# Patient Record
Sex: Male | Born: 1970 | Hispanic: No | Marital: Married | Smoking: Light tobacco smoker
Health system: Southern US, Community
[De-identification: ages and names within clinical notes are randomized; demographics above are authoritative.]

---

## 2015-10-14 ENCOUNTER — Encounter: Payer: Self-pay | Admitting: Family Medicine

## 2015-10-14 ENCOUNTER — Other Ambulatory Visit: Payer: Self-pay | Admitting: Family Medicine

## 2015-10-14 ENCOUNTER — Ambulatory Visit (INDEPENDENT_AMBULATORY_CARE_PROVIDER_SITE_OTHER): Payer: No Typology Code available for payment source | Admitting: Family Medicine

## 2015-10-14 VITALS — BP 128/78 | HR 83 | Temp 98.2°F | Resp 14 | Ht 67.0 in | Wt 154.0 lb

## 2015-10-14 DIAGNOSIS — Z0000 Encounter for general adult medical examination without abnormal findings: Secondary | ICD-10-CM

## 2015-10-14 DIAGNOSIS — IMO0001 Reserved for inherently not codable concepts without codable children: Secondary | ICD-10-CM

## 2015-10-14 DIAGNOSIS — Z23 Encounter for immunization: Secondary | ICD-10-CM

## 2015-10-14 DIAGNOSIS — K029 Dental caries, unspecified: Secondary | ICD-10-CM

## 2015-10-14 DIAGNOSIS — R030 Elevated blood-pressure reading, without diagnosis of hypertension: Secondary | ICD-10-CM

## 2015-10-14 LAB — COMPLETE METABOLIC PANEL WITH GFR
ALBUMIN: 4.2 g/dL (ref 3.6–5.1)
ALK PHOS: 48 U/L (ref 40–115)
ALT: 15 U/L (ref 9–46)
AST: 13 U/L (ref 10–40)
BILIRUBIN TOTAL: 0.6 mg/dL (ref 0.2–1.2)
BUN: 15 mg/dL (ref 7–25)
CALCIUM: 9.1 mg/dL (ref 8.6–10.3)
CO2: 25 mmol/L (ref 20–31)
CREATININE: 0.85 mg/dL (ref 0.60–1.35)
Chloride: 103 mmol/L (ref 98–110)
GFR, Est African American: >89 mL/min (ref >=60)
GFR, Est Non African American: >89 mL/min (ref >=60)
Glucose, Bld: 147 mg/dL — ABNORMAL HIGH (ref 65–99)
POTASSIUM: 3.9 mmol/L (ref 3.5–5.3)
Sodium: 138 mmol/L (ref 135–146)
Total Protein: 6.7 g/dL (ref 6.1–8.1)

## 2015-10-14 LAB — CBC WITH DIFFERENTIAL/PLATELET
Basophils Absolute: 0 cells/uL (ref 0–200)
Basophils Relative: 0 %
EOS PCT: 5 %
Eosinophils Absolute: 210 cells/uL (ref 15–500)
HCT: 43.1 % (ref 38.5–50.0)
Hemoglobin: 14.3 g/dL (ref 13.2–17.1)
LYMPHS ABS: 1890 {cells}/uL (ref 850–3900)
LYMPHS PCT: 45 %
MCH: 27 pg (ref 27.0–33.0)
MCHC: 33.2 g/dL (ref 32.0–36.0)
MCV: 81.3 fL (ref 80.0–100.0)
MONOS PCT: 6 %
MPV: 8.6 fL (ref 7.5–12.5)
Monocytes Absolute: 252 cells/uL (ref 200–950)
NEUTROS PCT: 44 %
Neutro Abs: 1848 cells/uL (ref 1500–7800)
PLATELETS: 231 10*3/uL (ref 140–400)
RBC: 5.3 MIL/uL (ref 4.20–5.80)
RDW: 14.1 % (ref 11.0–15.0)
WBC: 4.2 10*3/uL (ref 3.8–10.8)

## 2015-10-14 LAB — LIPID PANEL
CHOLESTEROL: 230 mg/dL — AB (ref 125–200)
HDL: 38 mg/dL — ABNORMAL LOW (ref >=40)
LDL Cholesterol: 163 mg/dL — ABNORMAL HIGH (ref <130)
TRIGLYCERIDES: 146 mg/dL (ref <150)
Total CHOL/HDL Ratio: 6.1 Ratio — ABNORMAL HIGH (ref <=5.0)
VLDL: 29 mg/dL (ref <30)

## 2015-10-14 NOTE — Patient Instructions (Signed)
Check

## 2015-10-14 NOTE — Progress Notes (Signed)
Patient ID: Grant King, male   DOB: 1970/11/11, 45 y.o.   MRN: [id number]   Grant King, is a 45 y.o. male  [id number]  [id number]  DOB - August 20, 1970  CC:  Chief Complaint  Patient presents with  . Establish Care       HPI: Grant King is a 45 y.o. male here to establish care. He reports being in good heatlh, without chronic illnesses. He has checked his blood pressure at drug store several times recently and is concerned about elevated readings of 150 ish. His BP here today is 128/78, repeat 120/84. (a reading using home equipment at same time is 130/84. He only other complaint is of dental caries and needing a dental referral. He has recently received an orange card.  He is on on current medications.  Health Maintenance: He is in need of Tdap and HIV screening. He does not use tobacco, alcohol, or illicit. He follows a regular diet and does not exercise regularly.   No Known Allergies History reviewed. No pertinent past medical history. No current outpatient prescriptions on file prior to visit.   No current facility-administered medications on file prior to visit.   Family History  Problem Relation Age of Onset  . Kidney disease Father    Social History   Social History  . Marital Status: Married    Spouse Name: N/A  . Number of Children: N/A  . Years of Education: N/A   Occupational History  . Not on file.   Social History Main Topics  . Smoking status: Light Tobacco Smoker -- 0.25 packs/day    Types: Cigarettes  . Smokeless tobacco: Not on file  . Alcohol Use: No  . Drug Use: No  . Sexual Activity: Not on file   Other Topics Concern  . Not on file   Social History Narrative  . No narrative on file    Review of Systems: Constitutional: Negative for fever, chills, appetite change, weight loss,  Fatigue. Skin: Negative for rashes or lesions of concern. HENT: Negative for ear pain, ear discharge.nose bleeds. Dental Caries Eyes: Negative  for pain, discharge, redness, itching and visual disturbance. Neck: Negative for pain, stiffness Respiratory: Negative for cough, shortness of breath,   Cardiovascular: Negative for chest pain, palpitations and leg swelling. Gastrointestinal: Negative for abdominal pain, nausea, vomiting, diarrhea, constipations Genitourinary: Negative for dysuria, urgency, frequency, hematuria,  Musculoskeletal: Negative for back pain, joint pain, joint  swelling, and gait problem.Negative for weakness. Neurological: Negative for dizziness, tremors, seizures, syncope,   light-headedness, numbness and headaches.  Hematological: Negative for easy bruising or bleeding Psychiatric/Behavioral: Negative for depression, anxiety, decreased concentration, confusion   Objective:   Filed Vitals:   10/14/15 1100  BP: 128/78  Pulse: 83  Temp: 98.2 F (36.8 C)  Resp: 14    Physical Exam: Constitutional: Patient appears well-developed and well-nourished. No distress. HENT: Normocephalic, atraumatic, External right and left ear normal. Oropharynx is clear and moist. There is one dark tooth on upper left. Eyes: Conjunctivae and EOM are normal. PERRLA, no scleral icterus. Neck: Normal ROM. Neck supple. No lymphadenopathy, No thyromegaly. CVS: RRR, S1/S2 +, no murmurs, no gallops, no rubs Pulmonary: Effort and breath sounds normal, no stridor, rhonchi, wheezes, rales.  Abdominal: Soft. Normoactive BS,, no distension, tenderness, rebound or guarding.  Musculoskeletal: Normal range of motion. No edema and no tenderness.  Neuro: Alert.Normal muscle tone coordination. Non-focal Skin: Skin is warm and dry. No rash noted. Not diaphoretic. No erythema. No pallor. Psychiatric: Normal mood  and affect. Behavior, judgment, thought content normal.  No results found for: WBC, HGB, HCT, MCV, PLT No results found for: CREATININE, BUN, NA, K, CL, CO2  No results found for: HGBA1C Lipid Panel  No results found for: CHOL, TRIG,  HDL, CHOLHDL, VLDL, LDLCALC     Assessment and plan:   1. Healthcare maintenance  - COMPLETE METABOLIC PANEL WITH GFR - CBC with Differential - Lipid panel  2. Dental caries -Referral to dentist.  3. Elevated blood pressure -provided home blood pressure monitor and instructions on how to interpret reading.   Return if symptoms worsen or fail to improve.  The patient was given clear instructions to go to ER or return to medical center if symptoms don't improve, worsen or new problems develop. The patient verbalized understanding.    Henrietta HooverLinda C Lakeita Panther FNP  10/14/2015, 12:16 PM

## 2015-10-15 ENCOUNTER — Telehealth: Payer: Self-pay

## 2015-10-15 ENCOUNTER — Other Ambulatory Visit: Payer: Self-pay | Admitting: Family Medicine

## 2015-10-15 LAB — HIV ANTIBODY (ROUTINE TESTING W REFLEX): HIV 1&2 Ab, 4th Generation: NONREACTIVE

## 2015-10-15 NOTE — Telephone Encounter (Signed)
-----   Message from Henrietta HooverLinda C Bernhardt, NP sent at 10/15/2015  8:05 AM EDT ----- Glucose 147 but not fasting.Cholesterol 230 total and LDL 163. We need to prescribed medication for this. CBC ok.

## 2015-10-18 ENCOUNTER — Other Ambulatory Visit: Payer: Self-pay | Admitting: Family Medicine

## 2015-10-18 MED ORDER — PRAVASTATIN SODIUM 40 MG PO TABS: 40.0000 mg | ORAL_TABLET | Freq: Every day | ORAL | Status: DC

## 2015-10-18 NOTE — Progress Notes (Signed)
Tried to contact patient by phone, have been unsuccessful. Will mail letter. Thanks!

## 2015-10-18 NOTE — Telephone Encounter (Signed)
Letter has been mailed, since unable to contact patient by phone. Thanks!

## 2015-10-19 ENCOUNTER — Other Ambulatory Visit: Payer: Self-pay

## 2015-10-19 MED ORDER — PRAVASTATIN SODIUM 40 MG PO TABS: 40.0000 mg | ORAL_TABLET | Freq: Every day | ORAL | Status: DC

## 2015-10-19 NOTE — Telephone Encounter (Signed)
Refill sent into pharmacy. Thanks!  

## 2016-02-16 ENCOUNTER — Other Ambulatory Visit: Payer: Self-pay | Admitting: Family Medicine

## 2016-04-05 ENCOUNTER — Other Ambulatory Visit: Payer: Self-pay | Admitting: Family Medicine

## 2016-04-21 ENCOUNTER — Ambulatory Visit: Payer: No Typology Code available for payment source | Admitting: Family Medicine

## 2016-04-21 DIAGNOSIS — E7800 Pure hypercholesterolemia, unspecified: Secondary | ICD-10-CM | POA: Insufficient documentation

## 2016-05-05 ENCOUNTER — Other Ambulatory Visit: Payer: Self-pay | Admitting: Family Medicine

## 2016-06-04 ENCOUNTER — Other Ambulatory Visit: Payer: Self-pay | Admitting: Family Medicine

## 2016-09-13 ENCOUNTER — Encounter (HOSPITAL_COMMUNITY): Payer: Self-pay | Admitting: *Deleted

## 2016-09-13 ENCOUNTER — Emergency Department (HOSPITAL_COMMUNITY)
Admission: EM | Admit: 2016-09-13 | Discharge: 2016-09-13 | Disposition: A | Discharge status: Home / Self Care | Payer: BLUE CROSS/BLUE SHIELD | Attending: Emergency Medicine | Admitting: Emergency Medicine

## 2016-09-13 DIAGNOSIS — R05 Cough: Secondary | ICD-10-CM | POA: Diagnosis present

## 2016-09-13 DIAGNOSIS — B349 Viral infection, unspecified: Secondary | ICD-10-CM | POA: Insufficient documentation

## 2016-09-13 DIAGNOSIS — R059 Cough, unspecified: Secondary | ICD-10-CM

## 2016-09-13 DIAGNOSIS — F17210 Nicotine dependence, cigarettes, uncomplicated: Secondary | ICD-10-CM | POA: Insufficient documentation

## 2016-09-13 LAB — BASIC METABOLIC PANEL
Anion gap: 9 (ref 5–15)
BUN: 10 mg/dL (ref 6–20)
CALCIUM: 9 mg/dL (ref 8.9–10.3)
CO2: 25 mmol/L (ref 22–32)
Chloride: 103 mmol/L (ref 101–111)
Creatinine, Ser: 0.93 mg/dL (ref 0.61–1.24)
GFR calc Af Amer: >60 mL/min (ref >60)
Glucose, Bld: 163 mg/dL — ABNORMAL HIGH (ref 65–99)
Potassium: 3.8 mmol/L (ref 3.5–5.1)
Sodium: 137 mmol/L (ref 135–145)

## 2016-09-13 LAB — CBC
HEMATOCRIT: 39.3 % (ref 39.0–52.0)
Hemoglobin: 13.2 g/dL (ref 13.0–17.0)
MCH: 27 pg (ref 26.0–34.0)
MCHC: 33.6 g/dL (ref 30.0–36.0)
MCV: 80.5 fL (ref 78.0–100.0)
Platelets: 193 10*3/uL (ref 150–400)
RBC: 4.88 MIL/uL (ref 4.22–5.81)
RDW: 12.4 % (ref 11.5–15.5)
WBC: 5 10*3/uL (ref 4.0–10.5)

## 2016-09-13 MED ORDER — ACETAMINOPHEN 325 MG PO TABS
ORAL_TABLET | ORAL | Status: AC
  Filled 2016-09-13: qty 2

## 2016-09-13 MED ORDER — IBUPROFEN 800 MG PO TABS
800.0000 mg | ORAL_TABLET | Freq: Once | ORAL | Status: AC
  Administered 2016-09-13: 800 mg via ORAL
  Filled 2016-09-13: qty 1

## 2016-09-13 MED ORDER — ACETAMINOPHEN 325 MG PO TABS
650.0000 mg | ORAL_TABLET | Freq: Once | ORAL | Status: AC | PRN
  Administered 2016-09-13: 650 mg via ORAL

## 2016-09-13 NOTE — ED Triage Notes (Addendum)
Pt to ED c/o generalized body aches, headache, lower extremity pain and fever onset today. Pt has been taking allergy medication OTC with relief

## 2016-09-13 NOTE — ED Provider Notes (Signed)
[facility]-EMERGENCY DEPT Provider Note   CSN: [id number] Arrival date & time: 09/13/16  0005     History   Chief Complaint Chief Complaint  Patient presents with  . Influenza    HPI Grant King is a 46 y.o. male.  The history is provided by the patient.  Influenza  Presenting symptoms: cough, fatigue, fever, headache, myalgias and sore throat   Presenting symptoms: no vomiting   Severity:  Moderate Onset quality:  Gradual Duration:  1 day Progression:  Worsening Chronicity:  New Relieved by:  None tried Worsened by:  Nothing   Patient presents with flu like illness - cough/HA/fever/sore throat No vomiting No recent travel   Patient Active Problem List   Diagnosis Date Noted  . Pure hypercholesterolemia 04/21/2016    History reviewed. No pertinent surgical history.     Home Medications    Prior to Admission medications   Not on File    Family History Family History  Problem Relation Age of Onset  . Kidney disease Father     Social History Social History  Substance Use Topics  . Smoking status: Light Tobacco Smoker    Packs/day: 0.25    Types: Cigarettes  . Smokeless tobacco: Never Used  . Alcohol use No     Allergies   Patient has no known allergies.   Review of Systems Review of Systems  Constitutional: Positive for fatigue and fever.  HENT: Positive for sore throat.   Respiratory: Positive for cough.   Gastrointestinal: Negative for vomiting.  Musculoskeletal: Positive for myalgias.  Neurological: Positive for headaches.  All other systems reviewed and are negative.    Physical Exam Updated Vital Signs BP (!) 135/100 (BP Location: Right Arm)   Pulse 79   Temp 98.8 F (37.1 C) (Oral)   Resp 18   SpO2 99%   Physical Exam CONSTITUTIONAL: sleeping but easily arousable, Well developed/well nourished HEAD: Normocephalic/atraumatic EYES: EOMI/PERRL ENMT: Mucous membranes moist NECK: supple no meningeal  signs SPINE/BACK:entire spine nontender CV: S1/S2 noted, no murmurs/rubs/gallops noted LUNGS: Lungs are clear to auscultation bilaterally, no apparent distress ABDOMEN: soft, nontender, no rebound or guarding, bowel sounds noted throughout abdomen GU:no cva tenderness NEURO: Pt is awake/alert/appropriate, moves all extremitiesx4.  No facial droop.   EXTREMITIES: pulses normal/equal, full ROM SKIN: warm, color normal PSYCH: no abnormalities of mood noted, alert and oriented to situation   ED Treatments / Results  Labs (all labs ordered are listed, but only abnormal results are displayed) Labs Reviewed  BASIC METABOLIC PANEL - Abnormal; Notable for the following:       Result Value   Glucose, Bld 163 (*)    All other components within normal limits  CBC    EKG  EKG Interpretation None       Radiology No results found.  Procedures Procedures (including critical care time)  Medications Ordered in ED Medications  acetaminophen (TYLENOL) tablet 650 mg (650 mg Oral Given 09/13/16 0024)  ibuprofen (ADVIL,MOTRIN) tablet 800 mg (800 mg Oral Given 09/13/16 5284)     Initial Impression / Assessment and Plan / ED Course  I have reviewed the triage vital signs and the nursing notes.  Pertinent labs results that were available during my care of the patient were reviewed by me and considered in my medical decision making (see chart for details).     Pt well appearing He reports cough/congestion/Ha He is nontoxic He is not septic appearing He has been in the ED for several hours and is  now feeling improved He is requesting discharge He reports no travel He has no other significant health problems  I feel it is reasonable for him to be discharged   Final Clinical Impressions(s) / ED Diagnoses   Final diagnoses:  Cough  Viral syndrome    New Prescriptions There are no discharge medications for this patient.    Zadie Rhine, MD 09/13/16 [phone]

## 2016-09-13 NOTE — ED Provider Notes (Signed)
Pt speaks english fluently on my exam    Zadie Rhine, MD 09/13/16 [phone]

## 2016-12-19 ENCOUNTER — Emergency Department (HOSPITAL_COMMUNITY)
Admission: EM | Admit: 2016-12-19 | Discharge: 2016-12-19 | Discharge status: Absent without leave | Payer: No Typology Code available for payment source

## 2016-12-19 NOTE — ED Notes (Signed)
Patient decided to leave and said he would go to his regular doctor or [facility].

## 2018-03-10 ENCOUNTER — Emergency Department (HOSPITAL_COMMUNITY)
Admission: EM | Admit: 2018-03-10 | Discharge: 2018-03-10 | Disposition: A | Discharge status: Home / Self Care | Payer: BLUE CROSS/BLUE SHIELD | Attending: Emergency Medicine | Admitting: Emergency Medicine

## 2018-03-10 ENCOUNTER — Encounter (HOSPITAL_COMMUNITY): Payer: Self-pay

## 2018-03-10 ENCOUNTER — Other Ambulatory Visit: Payer: Self-pay

## 2018-03-10 ENCOUNTER — Emergency Department (HOSPITAL_COMMUNITY): Payer: BLUE CROSS/BLUE SHIELD

## 2018-03-10 DIAGNOSIS — R079 Chest pain, unspecified: Secondary | ICD-10-CM | POA: Insufficient documentation

## 2018-03-10 DIAGNOSIS — Z5321 Procedure and treatment not carried out due to patient leaving prior to being seen by health care provider: Secondary | ICD-10-CM | POA: Insufficient documentation

## 2018-03-10 DIAGNOSIS — R0602 Shortness of breath: Secondary | ICD-10-CM | POA: Diagnosis not present

## 2018-03-10 LAB — I-STAT TROPONIN, ED: Troponin i, poc: 0 ng/mL (ref 0.00–0.08)

## 2018-03-10 LAB — BASIC METABOLIC PANEL
ANION GAP: 7 (ref 5–15)
BUN: 13 mg/dL (ref 6–20)
CO2: 24 mmol/L (ref 22–32)
Calcium: 9.2 mg/dL (ref 8.9–10.3)
Chloride: 106 mmol/L (ref 98–111)
Creatinine, Ser: 0.8 mg/dL (ref 0.61–1.24)
Glucose, Bld: 173 mg/dL — ABNORMAL HIGH (ref 70–99)
POTASSIUM: 3.8 mmol/L (ref 3.5–5.1)
SODIUM: 137 mmol/L (ref 135–145)

## 2018-03-10 LAB — CBC
HCT: 43.8 % (ref 39.0–52.0)
HEMOGLOBIN: 14.2 g/dL (ref 13.0–17.0)
MCH: 27.7 pg (ref 26.0–34.0)
MCHC: 32.4 g/dL (ref 30.0–36.0)
MCV: 85.4 fL (ref 78.0–100.0)
Platelets: 219 10*3/uL (ref 150–400)
RBC: 5.13 MIL/uL (ref 4.22–5.81)
RDW: 12 % (ref 11.5–15.5)
WBC: 6.1 10*3/uL (ref 4.0–10.5)

## 2018-03-10 NOTE — ED Triage Notes (Signed)
Pt from home with chest pain and shortness of breath for the last 2 days.  Patient states had a cough 3 days prior to that.  Pain with any movement and difficult to talk in full sentences.  A&Ox4 a this time.

## 2018-03-10 NOTE — ED Notes (Signed)
At the desk requesting to leave.  Encouraged to stay without success.  Left at this time.

## 2018-03-15 ENCOUNTER — Emergency Department (HOSPITAL_COMMUNITY)
Admission: EM | Admit: 2018-03-15 | Discharge: 2018-03-15 | Disposition: A | Discharge status: Home / Self Care | Payer: BLUE CROSS/BLUE SHIELD | Attending: Emergency Medicine | Admitting: Emergency Medicine

## 2018-03-15 ENCOUNTER — Encounter (HOSPITAL_COMMUNITY): Payer: Self-pay | Admitting: Nurse Practitioner

## 2018-03-15 ENCOUNTER — Emergency Department (HOSPITAL_COMMUNITY): Payer: BLUE CROSS/BLUE SHIELD

## 2018-03-15 DIAGNOSIS — F17210 Nicotine dependence, cigarettes, uncomplicated: Secondary | ICD-10-CM | POA: Insufficient documentation

## 2018-03-15 DIAGNOSIS — J209 Acute bronchitis, unspecified: Secondary | ICD-10-CM

## 2018-03-15 DIAGNOSIS — E7800 Pure hypercholesterolemia, unspecified: Secondary | ICD-10-CM | POA: Diagnosis not present

## 2018-03-15 DIAGNOSIS — J4521 Mild intermittent asthma with (acute) exacerbation: Secondary | ICD-10-CM | POA: Diagnosis not present

## 2018-03-15 DIAGNOSIS — R05 Cough: Secondary | ICD-10-CM | POA: Diagnosis present

## 2018-03-15 MED ORDER — ALBUTEROL (5 MG/ML) CONTINUOUS INHALATION SOLN
10.0000 mg/h | INHALATION_SOLUTION | RESPIRATORY_TRACT | Status: DC
  Administered 2018-03-15: 10 mg/h via RESPIRATORY_TRACT
  Filled 2018-03-15: qty 20

## 2018-03-15 MED ORDER — DEXAMETHASONE SODIUM PHOSPHATE 10 MG/ML IJ SOLN
10.0000 mg | Freq: Once | INTRAMUSCULAR | Status: AC
  Administered 2018-03-15: 10 mg via INTRAMUSCULAR
  Filled 2018-03-15: qty 1

## 2018-03-15 MED ORDER — AZITHROMYCIN 250 MG PO TABS
500.0000 mg | ORAL_TABLET | Freq: Once | ORAL | Status: AC
  Administered 2018-03-15: 500 mg via ORAL
  Filled 2018-03-15: qty 2

## 2018-03-15 MED ORDER — AZITHROMYCIN 250 MG PO TABS: ORAL_TABLET | ORAL | 0 refills | Status: DC

## 2018-03-15 MED ORDER — PREDNISONE 10 MG (21) PO TBPK: ORAL_TABLET | ORAL | 0 refills | Status: AC

## 2018-03-15 MED ORDER — AEROCHAMBER PLUS FLO-VU MEDIUM MISC
1.0000 | Freq: Once | Status: AC
  Administered 2018-03-15: 1
  Filled 2018-03-15: qty 1

## 2018-03-15 MED ORDER — ALBUTEROL SULFATE HFA 108 (90 BASE) MCG/ACT IN AERS
1.0000 | INHALATION_SPRAY | RESPIRATORY_TRACT | Status: DC | PRN
  Administered 2018-03-15: 1 via RESPIRATORY_TRACT
  Filled 2018-03-15: qty 6.7

## 2018-03-15 MED ORDER — ALBUTEROL SULFATE (2.5 MG/3ML) 0.083% IN NEBU
5.0000 mg | INHALATION_SOLUTION | Freq: Once | RESPIRATORY_TRACT | Status: AC
  Administered 2018-03-15: 5 mg via RESPIRATORY_TRACT
  Filled 2018-03-15: qty 6

## 2018-03-15 NOTE — ED Notes (Signed)
RN made aware of elevated BP 

## 2018-03-15 NOTE — ED Triage Notes (Signed)
Pt is c/o severe productive cough that has being ongoing for over 2 weeks. Remarks on green tinged phlegm and being "winded" when he coughs so bad.

## 2018-03-15 NOTE — ED Notes (Signed)
Patient refused to wait after the IM medication before leaving.

## 2018-03-15 NOTE — ED Provider Notes (Signed)
[facility]-EMERGENCY DEPT Provider Note   CSN: [id number] Arrival date & time: 03/15/18  1623     History   Chief Complaint Chief Complaint  Patient presents with  . Cough    HPI Grant King is a 47 y.o. male.  Pt presents to the ED today with a cough.  The pt said he has had a cough for about 2 weeks.  It started after smoking a cigarette which he normally does not do.  He has not had a fever.  He has been bringing up green phlegm.     History reviewed. No pertinent past medical history.  Patient Active Problem List   Diagnosis Date Noted  . Pure hypercholesterolemia 04/21/2016    History reviewed. No pertinent surgical history.      Home Medications    Prior to Admission medications   Medication Sig Start Date End Date Taking? Authorizing Provider  loratadine (ALLERGY) 10 MG tablet Take 10 mg by mouth daily as needed for allergies.   Yes [provider]  azithromycin (ZITHROMAX) 250 MG tablet Take 1 tablet every day. 03/16/18   Jacalyn Lefevre, MD  predniSONE (STERAPRED UNI-PAK 21 TAB) 10 MG (21) TBPK tablet Take 6 tabs by mouth daily  for 2 days, then 5 tabs for 2 days, then 4 tabs for 2 days, then 3 tabs for 2 days, 2 tabs for 2 days, then 1 tab by mouth daily for 2 days 03/15/18   Jacalyn Lefevre, MD    Family History Family History  Problem Relation Age of Onset  . Kidney disease Father     Social History Social History   Tobacco Use  . Smoking status: Light Tobacco Smoker    Packs/day: 0.25    Types: Cigarettes  . Smokeless tobacco: Never Used  Substance Use Topics  . Alcohol use: No  . Drug use: No     Allergies   Patient has no known allergies.   Review of Systems Review of Systems  Respiratory: Positive for cough, shortness of breath and wheezing.   All other systems reviewed and are negative.    Physical Exam Updated Vital Signs BP (!) 158/98   Pulse 86   Temp 98 F (36.7 C) (Oral)   Resp 15    SpO2 100%   Physical Exam  Constitutional: He is oriented to person, place, and time. He appears well-developed and well-nourished.  HENT:  Head: Normocephalic and atraumatic.  Right Ear: External ear normal.  Left Ear: External ear normal.  Nose: Nose normal.  Mouth/Throat: Oropharynx is clear and moist.  Eyes: Pupils are equal, round, and reactive to light. Conjunctivae and EOM are normal.  Neck: Normal range of motion. Neck supple.  Cardiovascular: Normal rate, regular rhythm, normal heart sounds and intact distal pulses.  Pulmonary/Chest: He has wheezes.  Abdominal: Soft. Bowel sounds are normal.  Musculoskeletal: Normal range of motion.  Neurological: He is alert and oriented to person, place, and time.  Skin: Skin is warm. Capillary refill takes less than 2 seconds.  Psychiatric: He has a normal mood and affect. His behavior is normal. Judgment and thought content normal.  Nursing note and vitals reviewed.    ED Treatments / Results  Labs (all labs ordered are listed, but only abnormal results are displayed) Labs Reviewed - No data to display  EKG None  Radiology Dg Chest 2 View  Result Date: 03/15/2018 CLINICAL DATA:  Shortness of breath EXAM: CHEST - 2 VIEW COMPARISON:  03/10/2018 FINDINGS:  The heart size and mediastinal contours are within normal limits. Both lungs are clear. The visualized skeletal structures are unremarkable. IMPRESSION: No active cardiopulmonary disease. Electronically Signed   By: Jasmine Pang M.D.   On: 03/15/2018 17:19    Procedures Procedures (including critical care time)  Medications Ordered in ED Medications  albuterol (PROVENTIL,VENTOLIN) solution continuous neb (10 mg/hr Nebulization New Bag/Given 03/15/18 2022)  dexamethasone (DECADRON) injection 10 mg (has no administration in time range)  albuterol (PROVENTIL HFA;VENTOLIN HFA) 108 (90 Base) MCG/ACT inhaler 1-2 puff (has no administration in time range)  AEROCHAMBER PLUS FLO-VU  MEDIUM MISC 1 each (has no administration in time range)  azithromycin (ZITHROMAX) tablet 500 mg (has no administration in time range)  albuterol (PROVENTIL) (2.5 MG/3ML) 0.083% nebulizer solution 5 mg (5 mg Nebulization Given 03/15/18 1648)     Initial Impression / Assessment and Plan / ED Course  I have reviewed the triage vital signs and the nursing notes.  Pertinent labs & imaging results that were available during my care of the patient were reviewed by me and considered in my medical decision making (see chart for details).    Pt is feeling much better.  He will be started on zithromax for bronchitis.  He will also be started on a prednisone taper and instructed to avoid smoke.  Return if worse.  Final Clinical Impressions(s) / ED Diagnoses   Final diagnoses:  Acute bronchitis, unspecified organism  Mild intermittent reactive airway disease with acute exacerbation    ED Discharge Orders         Ordered    azithromycin (ZITHROMAX) 250 MG tablet     03/15/18 2132    predniSONE (STERAPRED UNI-PAK 21 TAB) 10 MG (21) TBPK tablet     03/15/18 2132           Jacalyn Lefevre, MD 03/15/18 2135

## 2019-05-22 ENCOUNTER — Encounter (HOSPITAL_COMMUNITY): Payer: Self-pay | Admitting: Emergency Medicine

## 2019-05-22 ENCOUNTER — Emergency Department (HOSPITAL_COMMUNITY): Payer: BC Managed Care – PPO

## 2019-05-22 ENCOUNTER — Other Ambulatory Visit: Payer: Self-pay

## 2019-05-22 ENCOUNTER — Emergency Department (HOSPITAL_COMMUNITY)
Admission: EM | Admit: 2019-05-22 | Discharge: 2019-05-22 | Disposition: A | Discharge status: Home / Self Care | Payer: BC Managed Care – PPO | Attending: Emergency Medicine | Admitting: Emergency Medicine

## 2019-05-22 DIAGNOSIS — U071 COVID-19: Secondary | ICD-10-CM | POA: Insufficient documentation

## 2019-05-22 DIAGNOSIS — F17210 Nicotine dependence, cigarettes, uncomplicated: Secondary | ICD-10-CM | POA: Insufficient documentation

## 2019-05-22 DIAGNOSIS — F419 Anxiety disorder, unspecified: Secondary | ICD-10-CM | POA: Diagnosis not present

## 2019-05-22 DIAGNOSIS — R1110 Vomiting, unspecified: Secondary | ICD-10-CM | POA: Diagnosis present

## 2019-05-22 LAB — LIPASE, BLOOD: Lipase: 20 U/L (ref 11–51)

## 2019-05-22 LAB — CBC WITH DIFFERENTIAL/PLATELET
Abs Immature Granulocytes: 0.03 10*3/uL (ref 0.00–0.07)
Basophils Absolute: 0 10*3/uL (ref 0.0–0.1)
Basophils Relative: 0 %
Eosinophils Absolute: 0 10*3/uL (ref 0.0–0.5)
Eosinophils Relative: 1 %
HCT: 40.7 % (ref 39.0–52.0)
Hemoglobin: 13.4 g/dL (ref 13.0–17.0)
Immature Granulocytes: 1 %
Lymphocytes Relative: 22 %
Lymphs Abs: 1.2 10*3/uL (ref 0.7–4.0)
MCH: 27 pg (ref 26.0–34.0)
MCHC: 32.9 g/dL (ref 30.0–36.0)
MCV: 82.1 fL (ref 80.0–100.0)
Monocytes Absolute: 0.3 10*3/uL (ref 0.1–1.0)
Monocytes Relative: 6 %
Neutro Abs: 3.7 10*3/uL (ref 1.7–7.7)
Neutrophils Relative %: 70 %
Platelets: 260 10*3/uL (ref 150–400)
RBC: 4.96 MIL/uL (ref 4.22–5.81)
RDW: 12.6 % (ref 11.5–15.5)
WBC: 5.2 10*3/uL (ref 4.0–10.5)
nRBC: 0 % (ref 0.0–0.2)

## 2019-05-22 LAB — COMPREHENSIVE METABOLIC PANEL
ALT: 89 U/L — ABNORMAL HIGH (ref 0–44)
AST: 68 U/L — ABNORMAL HIGH (ref 15–41)
Albumin: 3 g/dL — ABNORMAL LOW (ref 3.5–5.0)
Alkaline Phosphatase: 104 U/L (ref 38–126)
Anion gap: 11 (ref 5–15)
BUN: 5 mg/dL — ABNORMAL LOW (ref 6–20)
CO2: 24 mmol/L (ref 22–32)
Calcium: 8.4 mg/dL — ABNORMAL LOW (ref 8.9–10.3)
Chloride: 101 mmol/L (ref 98–111)
Creatinine, Ser: 0.69 mg/dL (ref 0.61–1.24)
GFR calc Af Amer: >60 mL/min (ref >60)
GFR calc non Af Amer: >60 mL/min (ref >60)
Glucose, Bld: 170 mg/dL — ABNORMAL HIGH (ref 70–99)
Potassium: 4.3 mmol/L (ref 3.5–5.1)
Sodium: 136 mmol/L (ref 135–145)
Total Bilirubin: 0.6 mg/dL (ref 0.3–1.2)
Total Protein: 6.9 g/dL (ref 6.5–8.1)

## 2019-05-22 MED ORDER — SODIUM CHLORIDE 0.9 % IV BOLUS
1000.0000 mL | Freq: Once | INTRAVENOUS | Status: AC
  Administered 2019-05-22: 07:00:00 1000 mL via INTRAVENOUS

## 2019-05-22 MED ORDER — DEXAMETHASONE SODIUM PHOSPHATE 10 MG/ML IJ SOLN
10.0000 mg | Freq: Once | INTRAMUSCULAR | Status: AC
  Administered 2019-05-22: 10 mg via INTRAVENOUS
  Filled 2019-05-22: qty 1

## 2019-05-22 MED ORDER — ACETAMINOPHEN 325 MG PO TABS
650.0000 mg | ORAL_TABLET | Freq: Once | ORAL | Status: AC
  Administered 2019-05-22: 650 mg via ORAL
  Filled 2019-05-22: qty 2

## 2019-05-22 MED ORDER — ONDANSETRON HCL 4 MG/2ML IJ SOLN
4.0000 mg | Freq: Once | INTRAMUSCULAR | Status: AC
  Administered 2019-05-22: 07:00:00 4 mg via INTRAVENOUS
  Filled 2019-05-22: qty 2

## 2019-05-22 MED ORDER — ONDANSETRON 4 MG PO TBDP: 4.0000 mg | ORAL_TABLET | Freq: Three times a day (TID) | ORAL | 0 refills | Status: AC | PRN

## 2019-05-22 MED ORDER — AZITHROMYCIN 250 MG PO TABS: ORAL_TABLET | ORAL | 0 refills | Status: AC

## 2019-05-22 NOTE — ED Notes (Signed)
Pt drinking water 

## 2019-05-22 NOTE — ED Triage Notes (Signed)
Pt arrived from home via [facility] due to complaints of worsening COVID 19 symptoms. Pt tested positive last week for COVID 19 as well as his family. Pt states he has been vomiting and having diarrhea for 3 days. Pt also complains of tachypnea and SOB.   BP 131/86 HR 80 RR 18 Temp 100.6  CBG 259

## 2019-05-22 NOTE — ED Notes (Signed)
Pt sats 98% while ambulating in room

## 2019-05-22 NOTE — Discharge Instructions (Addendum)
Thank you for allowing [place] to care for you today.   Please return to the emergency department if you have any new or worsening symptoms.  Your x-ray today shows you have infection in your lungs consistent with your known Covid diagnosis.  -Your blood test today showed your liver enzymes were elevated.  Once you are feeling better and healed from Covid please follow-up with your primary care doctor to have these rechecked.  Medications- You can take medications to help treat your symptoms: -Tylenol for fever and body aches. Please take as prescribed on the bottle. -Over the coutner cough medicine such as mucinex, robitussin, or other brands. -Flonase for nasal congestion.  Prescription  sent to your pharmacy for azithromycin.  This is a antibiotic to help treat pneumonia.  Please take as prescribed. Prescription also sent for Zofran.  This is a nausea medication.  Please take it as needed and as prescribed.  Treatment- This is a virus and unfortunately there are no antibitotics approved to treat this virus at this time. It is important to monitor your symptoms closely: -You should have a theremometer at home to check your temperature when feeling feverish. -Use a pulse ox meter to measure your oxygen when feeling short of breath.  -If your fever is over 100.4 despite taking tylenol or if your oxygen level drops below 94% these are reasons to rturn to the emergency department for further evaluation. Please call the emergency department before you come to make [place] aware.    We recommend you self-isolate for 10 days and to inform your work/family/friends that you has the virus.  They will need to self-quarantine for 14 days to monitor for symptoms.    Again: symptoms of shortness of breath, chest pain, difficulty breathing, new onset of confuison, any symptoms that are concerning. And you or the person should come to emergency department for evaluation.   I hope you feel better soon

## 2019-05-22 NOTE — ED Provider Notes (Signed)
[facility]-EMERGENCY DEPT Provider Note   CSN: [id number] Arrival date & time: 05/22/19  0217     History Chief Complaint  Patient presents with  . COVID 19+    Grant King is a 48 y.o. male with past medical history consistent for hypercholesterolemia presents to emergency department today with chief complaint of worsening COVID-19 symptoms.  This is day 10 of illness.  He states [facility].  His family members at home also tested positive but are asymptomatic. He is having vomiting and diarrhea x3 days, stating every time he eats he immediately vomits. Admits to associated abdominal soreness after vomiting.  Denies any blood in emesis. Also denies blood in stool, unable to count how many episodes of diarrhea because there has been so many. He also endorses tachypnea and shortness of breath. Has nonproductive cough.  He has not taken any medications for symptoms prior to arrival.  He denies fever, chills, congestion, headache, sore throat, neck pain, chest pain, urinary symptoms. History provided by patient with additional history obtained from chart review.      No past medical history on file.  Patient Active Problem List   Diagnosis Date Noted  . Pure hypercholesterolemia 04/21/2016    No past surgical history on file.     Family History  Problem Relation Age of Onset  . Kidney disease Father     Social History   Tobacco Use  . Smoking status: Light Tobacco Smoker    Packs/day: 0.25    Types: Cigarettes  . Smokeless tobacco: Never Used  Substance Use Topics  . Alcohol use: No  . Drug use: No    Home Medications Prior to Admission medications   Medication Sig Start Date End Date Taking? Authorizing Provider  azithromycin (ZITHROMAX Z-PAK) 250 MG tablet Take 2 tablets (500 mg total) by mouth daily for 1 day, THEN 1 tablet (250 mg total) daily for 4 days. 05/22/19 05/27/19  ,  E, PA-C   loratadine (ALLERGY) 10 MG tablet Take 10 mg by mouth daily as needed for allergies.    [provider]  ondansetron (ZOFRAN ODT) 4 MG disintegrating tablet Take 1 tablet (4 mg total) by mouth every 8 (eight) hours as needed for nausea or vomiting. 05/22/19   , Yvonna Alanis E, PA-C  predniSONE (STERAPRED UNI-PAK 21 TAB) 10 MG (21) TBPK tablet Take 6 tabs by mouth daily  for 2 days, then 5 tabs for 2 days, then 4 tabs for 2 days, then 3 tabs for 2 days, 2 tabs for 2 days, then 1 tab by mouth daily for 2 days 03/15/18   Jacalyn Lefevre, MD    Allergies    Patient has no known allergies.  Review of Systems   Review of Systems  Constitutional: Negative for chills and fever.  HENT: Negative for congestion, rhinorrhea, sinus pressure and sore throat.   Eyes: Negative for pain and redness.  Respiratory: Positive for cough and shortness of breath. Negative for wheezing.   Cardiovascular: Negative for chest pain and palpitations.  Gastrointestinal: Positive for diarrhea, nausea and vomiting. Negative for abdominal pain and constipation.  Genitourinary: Negative for dysuria.  Musculoskeletal: Negative for arthralgias, back pain, myalgias and neck pain.  Skin: Negative for rash and wound.  Neurological: Negative for dizziness, syncope, weakness, numbness and headaches.  Psychiatric/Behavioral: Negative for confusion.    Physical Exam Updated Vital Signs BP (!) 130/91   Pulse 82   Temp 98.2 F (  36.8 C) (Oral)   Resp 20   SpO2 99%   Physical Exam Vitals and nursing note reviewed.  Constitutional:      General: He is not in acute distress.    Appearance: He is not ill-appearing.  HENT:     Head: Normocephalic and atraumatic.     Right Ear: Tympanic membrane and external ear normal.     Left Ear: Tympanic membrane and external ear normal.     Nose: Nose normal.     Mouth/Throat:     Mouth: Mucous membranes are dry.     Pharynx: Oropharynx is clear.  Eyes:     General: No  scleral icterus.       Right eye: No discharge.        Left eye: No discharge.     Extraocular Movements: Extraocular movements intact.     Conjunctiva/sclera: Conjunctivae normal.     Pupils: Pupils are equal, round, and reactive to light.  Neck:     Vascular: No JVD.  Cardiovascular:     Rate and Rhythm: Normal rate and regular rhythm.     Pulses: Normal pulses.          Radial pulses are 2+ on the right side and 2+ on the left side.     Heart sounds: Normal heart sounds.  Pulmonary:     Comments: Lung sounds diminished throughout. Symmetric chest rise. No wheezing, rales, or rhonchi. SP)2 is 99% on room air during exam Abdominal:     Comments: Abdomen is soft, non-distended, and non-tender in all quadrants. No rigidity, no guarding. No peritoneal signs. Normoactive bowel sounds  Musculoskeletal:        General: Normal range of motion.     Cervical back: Normal range of motion.  Skin:    General: Skin is warm and dry.     Capillary Refill: Capillary refill takes less than 2 seconds.  Neurological:     Mental Status: He is oriented to person, place, and time.     GCS: GCS eye subscore is 4. GCS verbal subscore is 5. GCS motor subscore is 6.     Comments: Fluent speech, no facial droop.  Psychiatric:        Mood and Affect: Mood is anxious.        Behavior: Behavior normal.     ED Results / Procedures / Treatments   Labs (all labs ordered are listed, but only abnormal results are displayed) Labs Reviewed  COMPREHENSIVE METABOLIC PANEL - Abnormal; Notable for the following components:      Result Value   Glucose, Bld 170 (*)    BUN 5 (*)    Calcium 8.4 (*)    Albumin 3.0 (*)    AST 68 (*)    ALT 89 (*)    All other components within normal limits  CBC WITH DIFFERENTIAL/PLATELET  LIPASE, BLOOD    EKG None  Radiology DG Chest Port 1 View  Result Date: 05/22/2019 CLINICAL DATA:  Shortness of breath and cough.  COVID-19 positive. EXAM: PORTABLE CHEST 1 VIEW  COMPARISON:  03/15/2018 FINDINGS: Lungs are adequately inflated demonstrate subtle hazy patchy density over the mid to lower lungs likely due to infection. Cardiomediastinal silhouette and remainder of the exam is unchanged. IMPRESSION: Subtle patchy hazy density over the mid to lower lungs likely infection. Electronically Signed   By: Elberta Fortisaniel  Boyle M.D.   On: 05/22/2019 07:37    Procedures Procedures (including critical care time)  Medications Ordered in  ED Medications  sodium chloride 0.9 % bolus 1,000 mL (0 mLs Intravenous Stopped 05/22/19 0932)  ondansetron (ZOFRAN) injection 4 mg (4 mg Intravenous Given 05/22/19 0658)  dexamethasone (DECADRON) injection 10 mg (10 mg Intravenous Given 05/22/19 0824)  acetaminophen (TYLENOL) tablet 650 mg (650 mg Oral Given 05/22/19 7371)    ED Course  I have reviewed the triage vital signs and the nursing notes.  Pertinent labs & imaging results that were available during my care of the patient were reviewed by me and considered in my medical decision making (see chart for details).    MDM Rules/Calculators/A&P   CHA2DS2/VAS Stroke Risk Points      N/A >= 2 Points: High Risk  1 - 1.99 Points: Medium Risk  0 Points: Low Risk    A final score could not be computed because of missing components.: Last  Change: N/A  This score determines the patient's risk of having a stroke if the  patient has atrial fibrillation.  This score is not applicable to this patient. Components are not  calculated.   Patient seen and examined. Patient febrile in triage at 100.6. No tachycardia or hypoxia. He appears very anxious. When speaking he is tachypneic however when I tell him to take a deep breath and slow down tachypnea resolves.  Lung sounds are diminished throughout.  No rales or rhonchi heard.  He is not in respiratory distress.  His abdomen is nontender, no peritoneal signs.  Labs ordered in triage.  I viewed these which show no leukocytosis, no anemia.  Lipase  is within normal range.  Liver enzymes are elevated with AST and ALT 68 and 89.  Normal anion gap of 11.  No severe electrolyte derangement.  No severe renal insufficiency.  Chest x-ray viewed by me shows patchy hazy densities over the mid to lower bilateral lungs, suggestive of infection. IVF, decadron, zofran and tylenol given. I personally ambulated the patient.  He did so without respiratory distress or hypoxia.  SPO2 stayed above 99% on room air. On reassessment he is tolerating PO fluids.  Serial abdominal exams are benign. Doubt acute surgical abdomen.   The patient appears reasonably screened and/or stabilized for discharge and I doubt any other medical condition or other [facility] requiring further screening, evaluation, or treatment in the ED at this time prior to discharge. Discharged with prescription for z-pak and zofran. The patient is safe for discharge with strict return precautions discussed. Recommend pcp follow up to have liver enzymes rechecked when he is asymptomatic from covid.    Purl Dowda was evaluated in Emergency Department on 05/22/2019 for the symptoms described in the history of present illness. He was evaluated in the context of the global COVID-19 pandemic, which necessitated consideration that the patient might be at risk for infection with the SARS-CoV-2 virus that causes COVID-19. Institutional protocols and algorithms that pertain to the evaluation of patients at risk for COVID-19 are in a state of rapid change based on information released by regulatory bodies including the CDC and federal and state organizations. These policies and algorithms were followed during the patient's care in the ED.   Portions of this note were generated with Scientist, clinical (histocompatibility and immunogenetics). Dictation errors may occur despite best attempts at proofreading.  Final Clinical Impression(s) / ED Diagnoses Final diagnoses:  COVID-19 virus infection    Rx / DC Orders ED Discharge Orders          Ordered    ondansetron (ZOFRAN ODT) 4 MG disintegrating tablet  Every 8 hours PRN     05/22/19 0902    azithromycin (ZITHROMAX Z-PAK) 250 MG tablet     05/22/19 0902           Cherre Robins, PA-C 05/22/19 [place], [place], DO 05/22/19 [phone]

## 2019-05-24 ENCOUNTER — Emergency Department (HOSPITAL_COMMUNITY)
Admission: EM | Admit: 2019-05-24 | Discharge: 2019-05-24 | Disposition: A | Discharge status: Home / Self Care | Payer: BC Managed Care – PPO | Attending: Emergency Medicine | Admitting: Emergency Medicine

## 2019-05-24 ENCOUNTER — Encounter (HOSPITAL_COMMUNITY): Payer: Self-pay | Admitting: Emergency Medicine

## 2019-05-24 ENCOUNTER — Other Ambulatory Visit: Payer: Self-pay

## 2019-05-24 DIAGNOSIS — M542 Cervicalgia: Secondary | ICD-10-CM | POA: Diagnosis present

## 2019-05-24 DIAGNOSIS — U071 COVID-19: Secondary | ICD-10-CM | POA: Diagnosis not present

## 2019-05-24 DIAGNOSIS — F17210 Nicotine dependence, cigarettes, uncomplicated: Secondary | ICD-10-CM | POA: Diagnosis not present

## 2019-05-24 MED ORDER — METHOCARBAMOL 500 MG PO TABS
1000.0000 mg | ORAL_TABLET | Freq: Once | ORAL | Status: AC
  Administered 2019-05-24: 20:00:00 1000 mg via ORAL
  Filled 2019-05-24: qty 2

## 2019-05-24 MED ORDER — METHOCARBAMOL 500 MG PO TABS: 1000.0000 mg | ORAL_TABLET | Freq: Three times a day (TID) | ORAL | 0 refills | Status: AC | PRN

## 2019-05-24 MED ORDER — ACETAMINOPHEN 500 MG PO TABS
1000.0000 mg | ORAL_TABLET | Freq: Once | ORAL | Status: AC
  Administered 2019-05-24: 20:00:00 1000 mg via ORAL
  Filled 2019-05-24: qty 2

## 2019-05-24 MED ORDER — IBUPROFEN 600 MG PO TABS: 600.0000 mg | ORAL_TABLET | Freq: Four times a day (QID) | ORAL | 0 refills | Status: AC | PRN

## 2019-05-24 MED ORDER — KETOROLAC TROMETHAMINE 60 MG/2ML IM SOLN
60.0000 mg | Freq: Once | INTRAMUSCULAR | Status: AC
  Administered 2019-05-24: 60 mg via INTRAMUSCULAR
  Filled 2019-05-24: qty 2

## 2019-05-24 NOTE — ED Triage Notes (Signed)
Pt reports tested Covid+ 2 weeks ago at [facility]. Reports feeling tired/weak, having headache and fever. Was seen here 2 days ago.

## 2019-05-24 NOTE — Discharge Instructions (Signed)
You may take Tylenol in addition to the ibuprofen and Robaxin as needed for headache and muscle aches.

## 2019-05-24 NOTE — ED Provider Notes (Signed)
[facility] DEPT Provider Note   CSN: [id number] Arrival date & time: 05/24/19  1808     History Chief Complaint  Patient presents with  . covid+  . Headache  . Fatigue    Grant King is a 48 y.o. male.  HPI    Patient was evaluated 2 days ago for COVID-19.  He is in his 12th day of symptoms.  States over the last few days he developed left-sided neck pain and headache.  No neck stiffness.  No focal weakness or numbness.  Continues to have cough and fatigue.  He denies any abdominal pain, vomiting or diarrhea.  States has been taking Tylenol at home for his symptoms. History reviewed. No pertinent past medical history.  Patient Active Problem List   Diagnosis Date Noted  . Pure hypercholesterolemia 04/21/2016    History reviewed. No pertinent surgical history.     Family History  Problem Relation Age of Onset  . Kidney disease Father     Social History   Tobacco Use  . Smoking status: Light Tobacco Smoker    Packs/day: 0.25    Types: Cigarettes  . Smokeless tobacco: Never Used  Substance Use Topics  . Alcohol use: No  . Drug use: No    Home Medications Prior to Admission medications   Medication Sig Start Date End Date Taking? Authorizing Provider  azithromycin (ZITHROMAX Z-PAK) 250 MG tablet Take 2 tablets (500 mg total) by mouth daily for 1 day, THEN 1 tablet (250 mg total) daily for 4 days. 05/22/19 05/27/19  Albrizze, Kaitlyn E, PA-C  ibuprofen (ADVIL) 600 MG tablet Take 1 tablet (600 mg total) by mouth every 6 (six) hours as needed. 05/24/19   Julianne Rice, MD  loratadine (ALLERGY) 10 MG tablet Take 10 mg by mouth daily as needed for allergies.    [provider]  methocarbamol (ROBAXIN) 500 MG tablet Take 2 tablets (1,000 mg total) by mouth every 8 (eight) hours as needed for muscle spasms. 05/24/19   Julianne Rice, MD  ondansetron (ZOFRAN ODT) 4 MG disintegrating tablet Take 1 tablet (4 mg total) by  mouth every 8 (eight) hours as needed for nausea or vomiting. 05/22/19   Albrizze, Verline Lema E, PA-C  predniSONE (STERAPRED UNI-PAK 21 TAB) 10 MG (21) TBPK tablet Take 6 tabs by mouth daily  for 2 days, then 5 tabs for 2 days, then 4 tabs for 2 days, then 3 tabs for 2 days, 2 tabs for 2 days, then 1 tab by mouth daily for 2 days 03/15/18   Isla Pence, MD    Allergies    Patient has no known allergies.  Review of Systems   Review of Systems  Constitutional: Positive for fatigue and fever. Negative for activity change and appetite change.  HENT: Negative for sore throat and trouble swallowing.   Eyes: Negative for pain and visual disturbance.  Respiratory: Positive for cough.   Cardiovascular: Negative for chest pain, palpitations and leg swelling.  Gastrointestinal: Negative for abdominal pain, constipation, diarrhea, nausea and vomiting.  Genitourinary: Negative for dysuria, flank pain, frequency and hematuria.  Musculoskeletal: Positive for back pain, myalgias and neck pain. Negative for neck stiffness.  Skin: Negative for rash and wound.  Neurological: Positive for headaches. Negative for dizziness, weakness, light-headedness and numbness.  All other systems reviewed and are negative.   Physical Exam Updated Vital Signs BP (!) 131/94 (BP Location: Left Arm)   Pulse 90   Temp 99.1 F (37.3 C) (Oral)  Resp (!) 24   SpO2 99%   Physical Exam Vitals and nursing note reviewed.  Constitutional:      General: He is not in acute distress.    Appearance: Normal appearance. He is well-developed. He is not ill-appearing.  HENT:     Head: Normocephalic and atraumatic.     Nose: Nose normal.  Eyes:     Pupils: Pupils are equal, round, and reactive to light.  Neck:     Comments: Patient with left trapezius tenderness to palpation.  There is no lymphadenopathy.  No meningismus. Cardiovascular:     Rate and Rhythm: Normal rate and regular rhythm.     Heart sounds: No murmur. No  friction rub. No gallop.   Pulmonary:     Effort: Pulmonary effort is normal. No respiratory distress.     Breath sounds: Normal breath sounds. No stridor. No wheezing, rhonchi or rales.  Chest:     Chest wall: No tenderness.  Abdominal:     General: Bowel sounds are normal.     Palpations: Abdomen is soft.     Tenderness: There is no abdominal tenderness. There is no guarding or rebound.  Musculoskeletal:        General: No swelling, tenderness, deformity or signs of injury. Normal range of motion.     Cervical back: Normal range of motion and neck supple. No rigidity.     Right lower leg: No edema.     Left lower leg: No edema.     Comments: No midline thoracic or lumbar tenderness.  No CVA tenderness.  Lymphadenopathy:     Cervical: No cervical adenopathy.  Skin:    General: Skin is warm and dry.     Findings: No erythema or rash.  Neurological:     General: No focal deficit present.     Mental Status: He is alert and oriented to person, place, and time.     Comments: Moving all extremities without focal deficit.  Sensation fully intact.  Psychiatric:        Behavior: Behavior normal.     ED Results / Procedures / Treatments   Labs (all labs ordered are listed, but only abnormal results are displayed) Labs Reviewed - No data to display  EKG None  Radiology No results found.  Procedures Procedures (including critical care time)  Medications Ordered in ED Medications  ketorolac (TORADOL) injection 60 mg (60 mg Intramuscular Given 05/24/19 2012)  acetaminophen (TYLENOL) tablet 1,000 mg (1,000 mg Oral Given 05/24/19 2012)  methocarbamol (ROBAXIN) tablet 1,000 mg (1,000 mg Oral Given 05/24/19 2012)    ED Course  I have reviewed the triage vital signs and the nursing notes.  Pertinent labs & imaging results that were available during my care of the patient were reviewed by me and considered in my medical decision making (see chart for details).    MDM  Rules/Calculators/A&P     CHA2DS2/VAS Stroke Risk Points      N/A >= 2 Points: High Risk  1 - 1.99 Points: Medium Risk  0 Points: Low Risk    A final score could not be computed because of missing components.: Last  Change: N/A     This score determines the patient's risk of having a stroke if the  patient has atrial fibrillation.      This score is not applicable to this patient. Components are not  calculated.  We will treat patient symptomatically.  Vital signs are stable.  Patient is well-appearing with normal neurologic exam.  Do not believe that labs and x-ray need to be repeated at this time.   Patient feeling better after medication.  Low suspicion for encephalitis/meningitis.  Vital signs remained stable.  Return precautions given. Final Clinical Impression(s) / ED Diagnoses Final diagnoses:  COVID-19    Rx / DC Orders ED Discharge Orders         Ordered    ibuprofen (ADVIL) 600 MG tablet  Every 6 hours PRN     05/24/19 2022    methocarbamol (ROBAXIN) 500 MG tablet  Every 8 hours PRN     05/24/19 2022           Loren RacerYelverton, Shyasia Funches, MD 05/24/19 2023

## 2019-12-03 IMAGING — DX DG CHEST 2V
2 series · 2 of 2 positions shown · non-contrast
Comparison: None.

CLINICAL DATA: Chest pain, shortness of breath x2 days

EXAM:
CHEST - 2 VIEW

[chest pa]
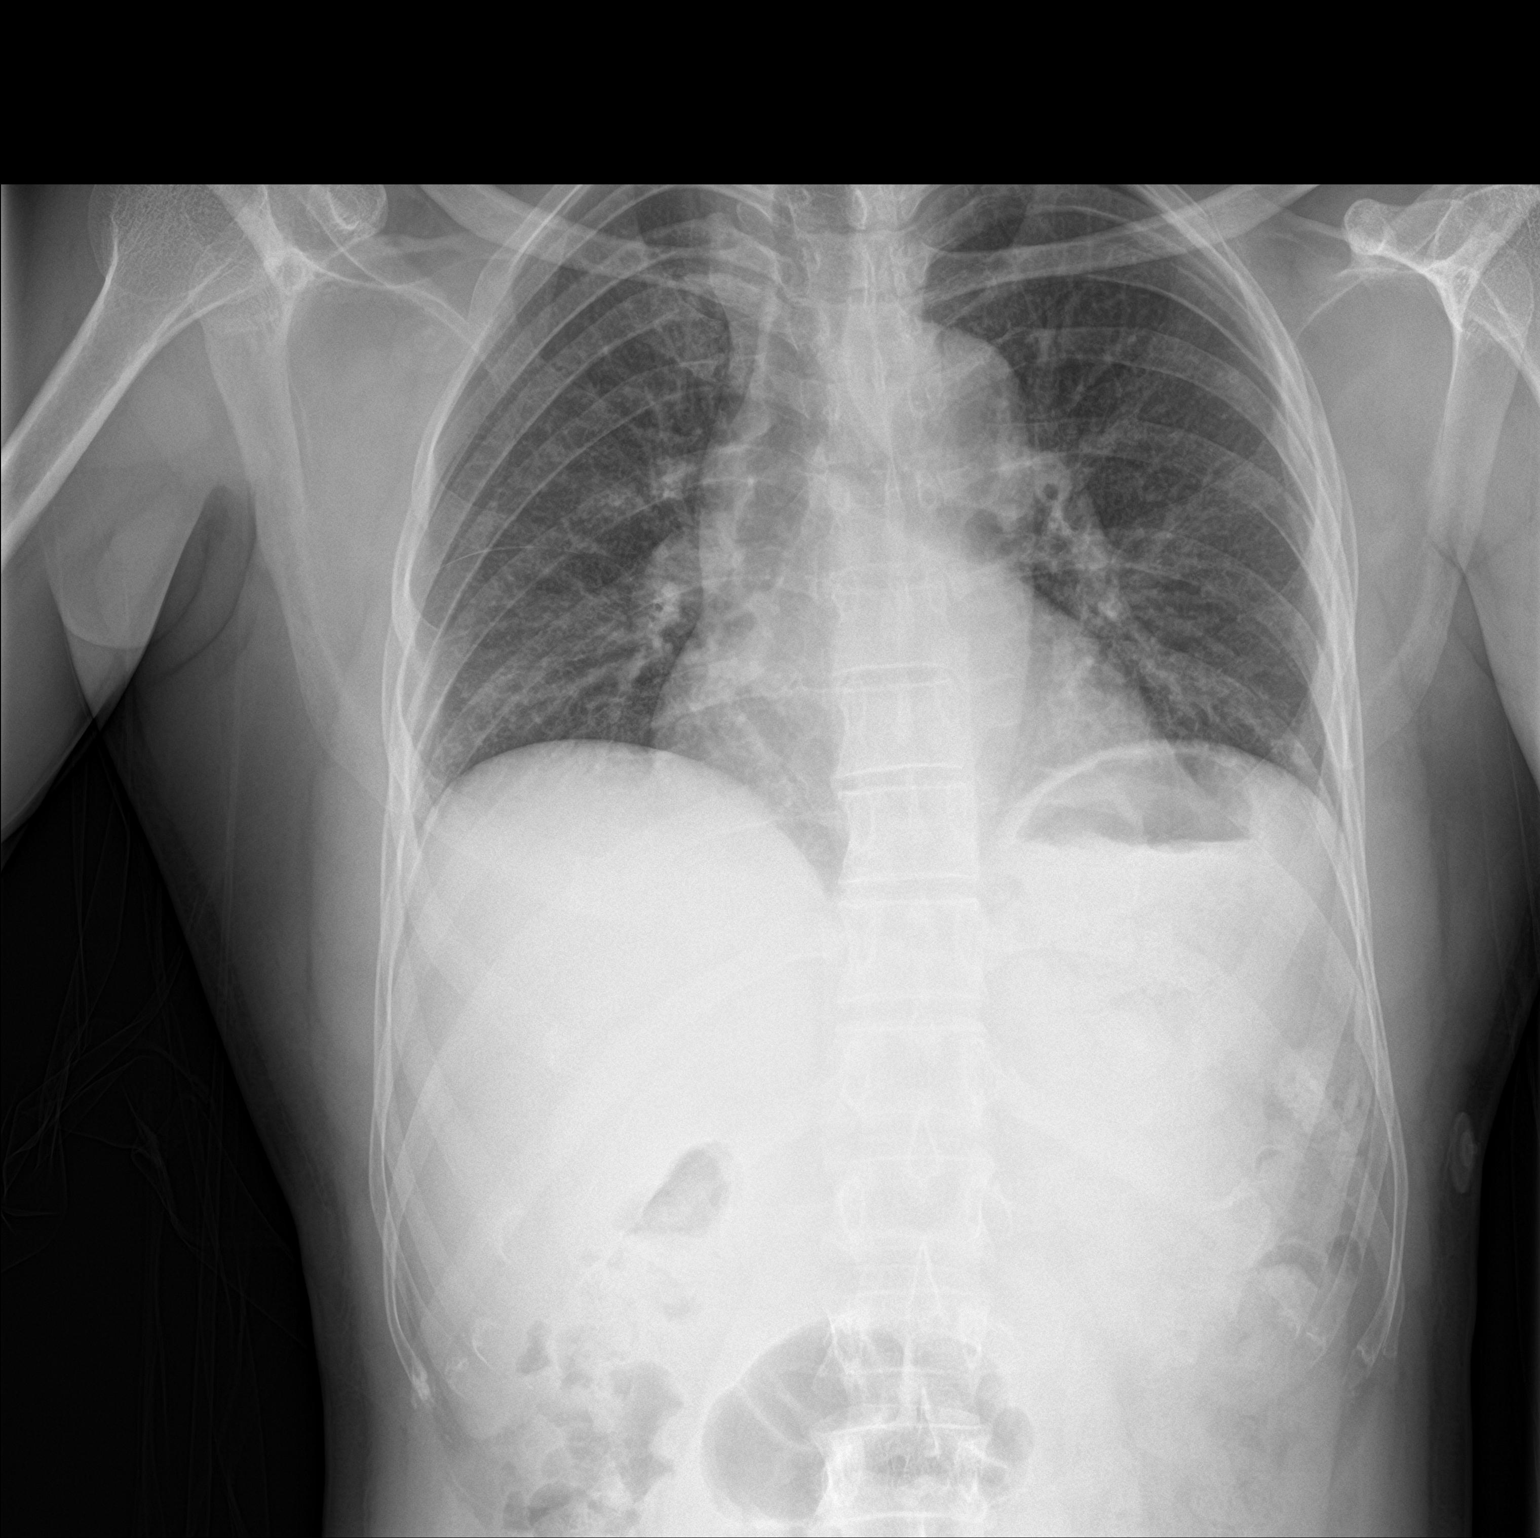

[chest lat]
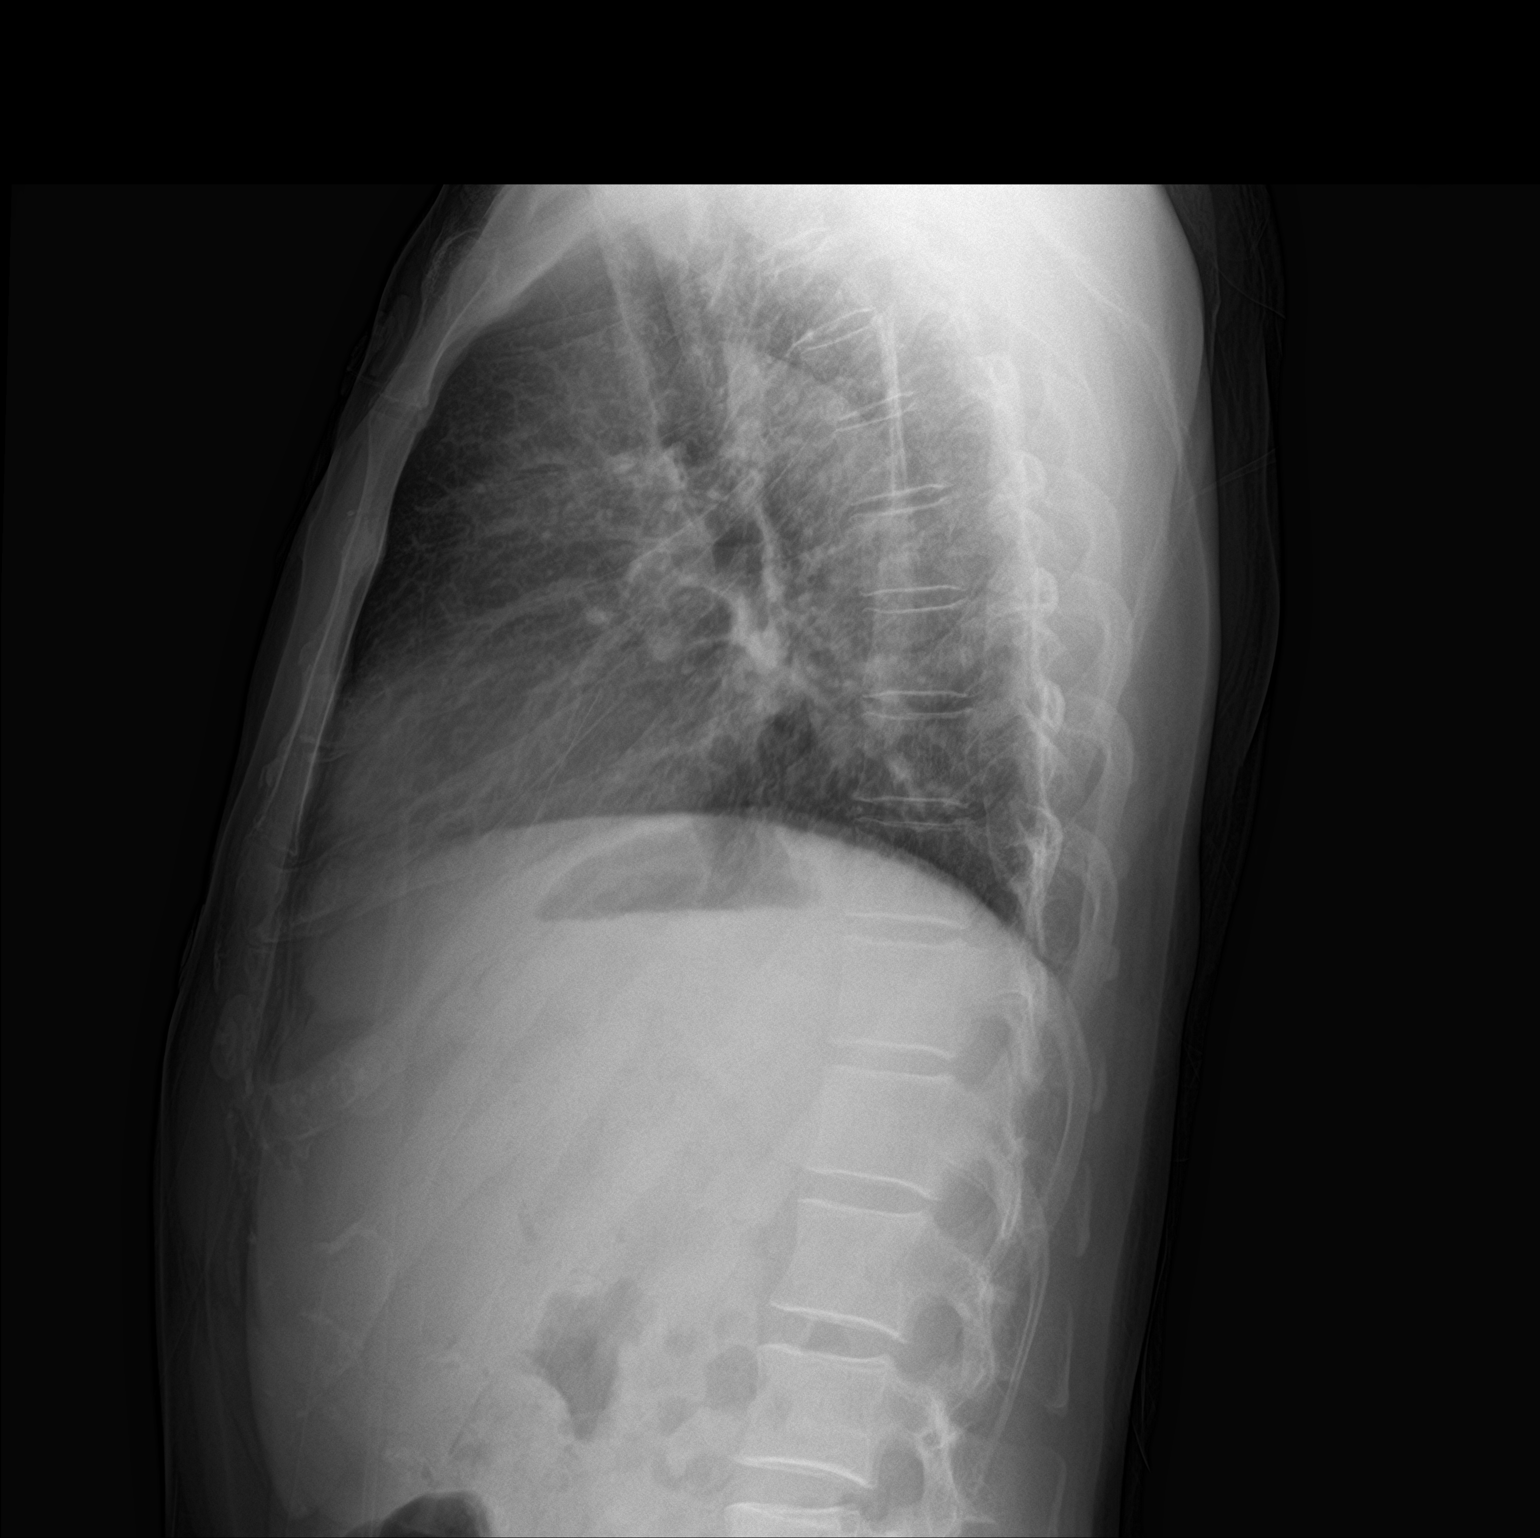

[2 of 2 positions shown; findings below may reference images not displayed]

FINDINGS: Mild bibasilar opacities, likely atelectasis. No focal
consolidation. No pleural effusion or pneumothorax.

The heart is normal in size.

Visualized osseous structures are within normal limits.
IMPRESSION: Mild bibasilar opacities, likely atelectasis.

## 2021-02-13 IMAGING — DX DG CHEST 1V PORT
1 series · 1 of 1 positions shown · non-contrast
Comparison: 03/15/2018

CLINICAL DATA: Shortness of breath and cough.  [id number] positive.

EXAM:
PORTABLE CHEST 1 VIEW

[chest ap]
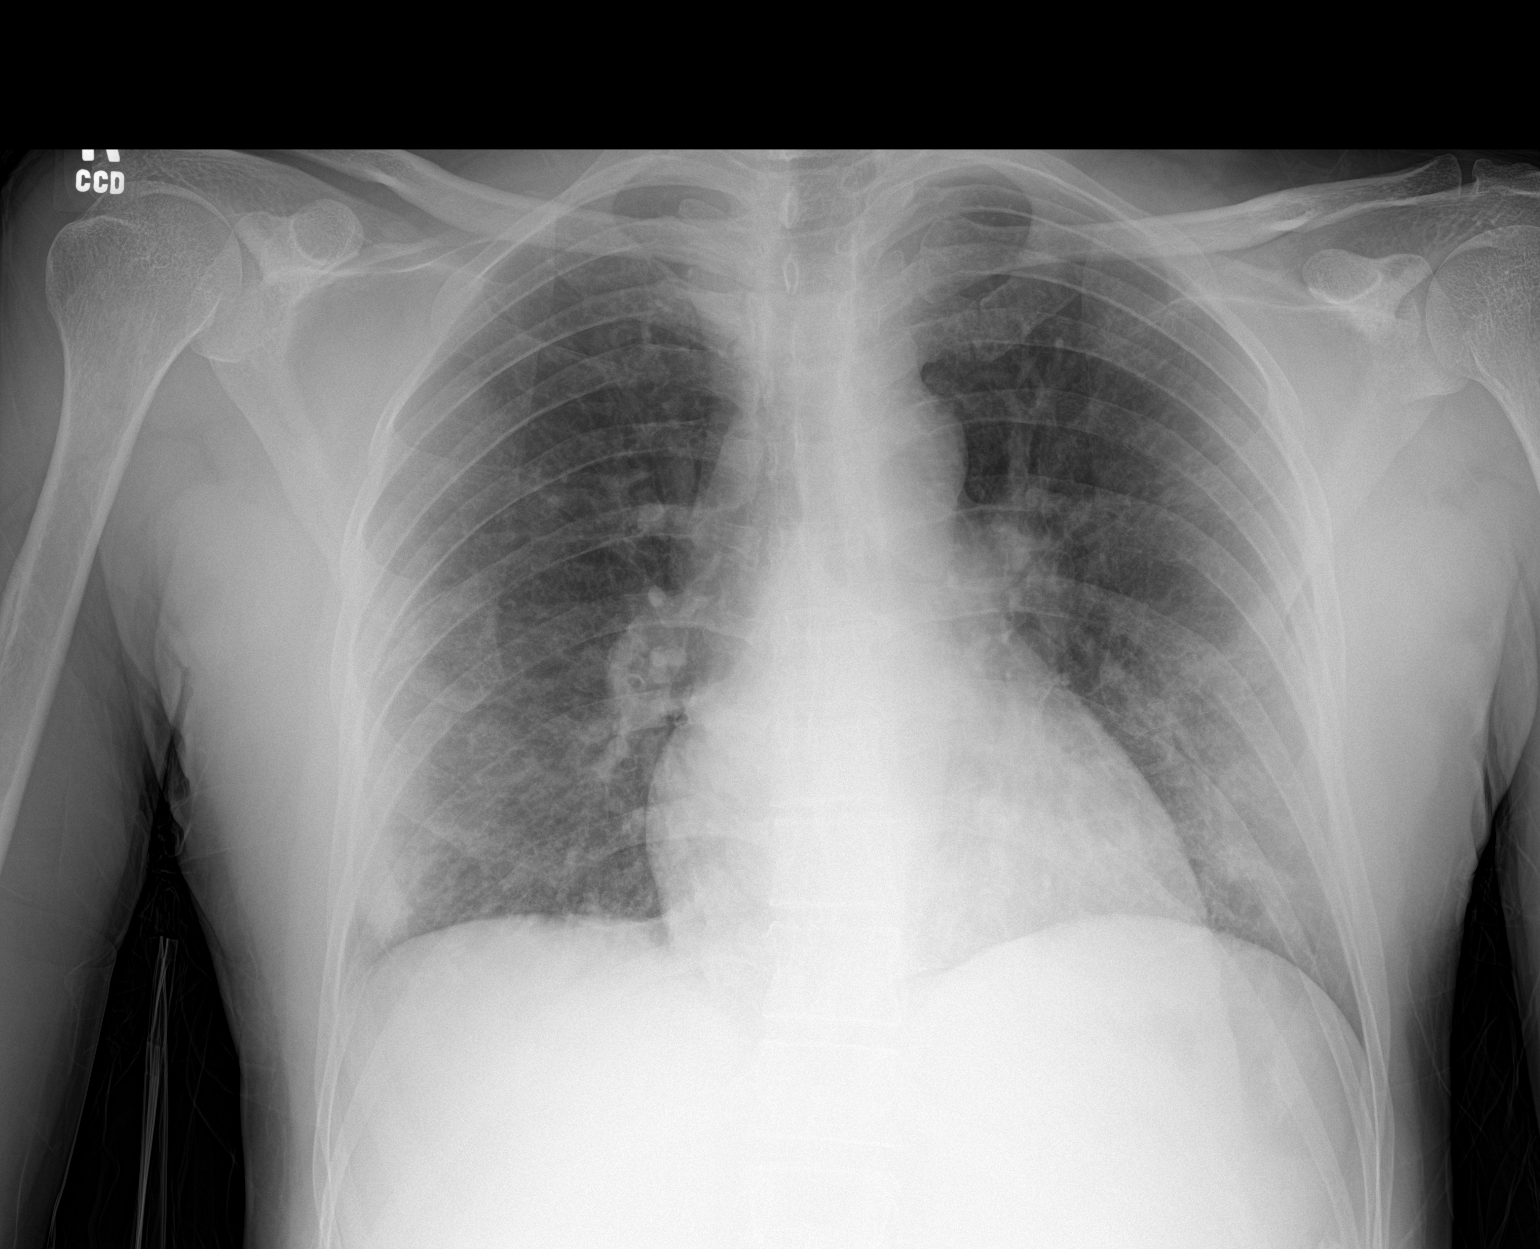

[1 of 1 positions shown; findings below may reference images not displayed]

FINDINGS: Lungs are adequately inflated demonstrate subtle hazy patchy density
over the mid to lower lungs likely due to infection.
Cardiomediastinal silhouette and remainder of the exam is unchanged.
IMPRESSION: Subtle patchy hazy density over the mid to lower lungs likely
infection.

## 2022-01-16 ENCOUNTER — Encounter (HOSPITAL_COMMUNITY): Payer: Self-pay | Admitting: Emergency Medicine

## 2022-01-16 ENCOUNTER — Emergency Department (HOSPITAL_COMMUNITY)
Admission: EM | Admit: 2022-01-16 | Discharge: 2022-01-17 | Disposition: A | Discharge status: Home / Self Care | Payer: BC Managed Care – PPO | Attending: Student | Admitting: Student

## 2022-01-16 ENCOUNTER — Emergency Department (HOSPITAL_COMMUNITY): Payer: BC Managed Care – PPO

## 2022-01-16 DIAGNOSIS — U071 COVID-19: Secondary | ICD-10-CM

## 2022-01-16 DIAGNOSIS — R059 Cough, unspecified: Secondary | ICD-10-CM | POA: Diagnosis present

## 2022-01-16 LAB — CBC WITH DIFFERENTIAL/PLATELET
Abs Immature Granulocytes: 0.01 10*3/uL (ref 0.00–0.07)
Basophils Absolute: 0 10*3/uL (ref 0.0–0.1)
Basophils Relative: 1 %
Eosinophils Absolute: 0 10*3/uL (ref 0.0–0.5)
Eosinophils Relative: 0 %
HCT: 38.4 % — ABNORMAL LOW (ref 39.0–52.0)
Hemoglobin: 13.1 g/dL (ref 13.0–17.0)
Immature Granulocytes: 0 %
Lymphocytes Relative: 14 %
Lymphs Abs: 0.5 10*3/uL — ABNORMAL LOW (ref 0.7–4.0)
MCH: 27.5 pg (ref 26.0–34.0)
MCHC: 34.1 g/dL (ref 30.0–36.0)
MCV: 80.5 fL (ref 80.0–100.0)
Monocytes Absolute: 0.3 10*3/uL (ref 0.1–1.0)
Monocytes Relative: 8 %
Neutro Abs: 2.9 10*3/uL (ref 1.7–7.7)
Neutrophils Relative %: 77 %
Platelets: 202 10*3/uL (ref 150–400)
RBC: 4.77 MIL/uL (ref 4.22–5.81)
RDW: 12.4 % (ref 11.5–15.5)
WBC: 3.7 10*3/uL — ABNORMAL LOW (ref 4.0–10.5)
nRBC: 0 % (ref 0.0–0.2)

## 2022-01-16 LAB — COMPREHENSIVE METABOLIC PANEL
ALT: 11 U/L (ref 0–44)
AST: 15 U/L (ref 15–41)
Albumin: 4 g/dL (ref 3.5–5.0)
Alkaline Phosphatase: 49 U/L (ref 38–126)
Anion gap: 8 (ref 5–15)
BUN: 12 mg/dL (ref 6–20)
CO2: 22 mmol/L (ref 22–32)
Calcium: 8.9 mg/dL (ref 8.9–10.3)
Chloride: 105 mmol/L (ref 98–111)
Creatinine, Ser: 0.96 mg/dL (ref 0.61–1.24)
GFR, Estimated: >60 mL/min (ref >60)
Glucose, Bld: 122 mg/dL — ABNORMAL HIGH (ref 70–99)
Potassium: 3.5 mmol/L (ref 3.5–5.1)
Sodium: 135 mmol/L (ref 135–145)
Total Bilirubin: 0.9 mg/dL (ref 0.3–1.2)
Total Protein: 6.7 g/dL (ref 6.5–8.1)

## 2022-01-16 LAB — RESP PANEL BY RT-PCR (FLU A&B, COVID) ARPGX2
Influenza A by PCR: NEGATIVE
Influenza B by PCR: NEGATIVE
SARS Coronavirus 2 by RT PCR: POSITIVE — AB

## 2022-01-16 MED ORDER — ACETAMINOPHEN 325 MG PO TABS
325.0000 mg | ORAL_TABLET | Freq: Once | ORAL | Status: DC
  Filled 2022-01-16: qty 1

## 2022-01-16 MED ORDER — ACETAMINOPHEN 325 MG PO TABS
650.0000 mg | ORAL_TABLET | Freq: Once | ORAL | Status: AC
  Administered 2022-01-16: 650 mg via ORAL

## 2022-01-16 NOTE — ED Provider Triage Note (Signed)
Emergency Medicine Provider Triage Evaluation Note  Grant King , a 51 y.o. male  was evaluated in triage.  Pt complains of bodyaches, chills, headache, fever, cough that began yesterday.  Patient denies chest pain,, abdominal pain, vomiting.  Also endorses nausea.  Patient denies any recent sick contacts.  States that he has had 3 COVID shots.  Review of Systems  Positive: Body aches, fever, headache, cough Negative: Chest pain, abdominal pain  Physical Exam  BP 137/89 (BP Location: Right Arm)   Pulse 95   Temp (!) 101.6 F (38.7 C) (Oral)   Resp (!) 22   SpO2 99%  Gen:   Awake, no distress   Resp:  Normal effort  MSK:   Moves extremities without difficulty  Other:    Medical Decision Making  Medically screening exam initiated at 6:05 PM.  Appropriate orders placed.  Grant King was informed that the remainder of the evaluation will be completed by another provider, this initial triage assessment does not replace that evaluation, and the importance of remaining in the ED until their evaluation is complete.     Darrick Grinder, PA-C 01/16/22 1806

## 2022-01-16 NOTE — ED Triage Notes (Signed)
Patient here with complaint of cough, fever, and generalized body aches that started today. Patient is alert, oriented, and in no apparent distress at this time.

## 2022-01-17 MED ORDER — IBUPROFEN 800 MG PO TABS: 800.0000 mg | ORAL_TABLET | Freq: Once | ORAL | Status: DC

## 2022-01-17 MED ORDER — BENZONATATE 200 MG PO CAPS: 200.0000 mg | ORAL_CAPSULE | Freq: Three times a day (TID) | ORAL | 0 refills | Status: AC

## 2022-01-17 NOTE — ED Provider Notes (Signed)
[facility] EMERGENCY DEPARTMENT Provider Note   CSN: [id number] Arrival date & time: 01/16/22  1745     History  Chief Complaint  Patient presents with   Fever    Teven Geiman is a 51 y.o. male.  51 year old male with no significant past medical history presents to the emergency room with cough, fever, body aches.  Symptoms started today.  No known sick contacts.  Patient is an occasional smoker, does not take medications daily.  Was given medication for his fever on arrival which she feels is starting to wear off.  Has been vaccinated against COVID.       Home Medications Prior to Admission medications   Medication Sig Start Date End Date Taking? Authorizing Provider  benzonatate (TESSALON) 200 MG capsule Take 1 capsule (200 mg total) by mouth every 8 (eight) hours for 10 days. 01/17/22 01/27/22 Yes Jeannie Fend, PA-C  ibuprofen (ADVIL) 600 MG tablet Take 1 tablet (600 mg total) by mouth every 6 (six) hours as needed. 05/24/19   Loren Racer, MD  loratadine (ALLERGY) 10 MG tablet Take 10 mg by mouth daily as needed for allergies.    [provider]  methocarbamol (ROBAXIN) 500 MG tablet Take 2 tablets (1,000 mg total) by mouth every 8 (eight) hours as needed for muscle spasms. 05/24/19   Loren Racer, MD  ondansetron (ZOFRAN ODT) 4 MG disintegrating tablet Take 1 tablet (4 mg total) by mouth every 8 (eight) hours as needed for nausea or vomiting. 05/22/19   Shanon Ace, PA-C  predniSONE (STERAPRED UNI-PAK 21 TAB) 10 MG (21) TBPK tablet Take 6 tabs by mouth daily  for 2 days, then 5 tabs for 2 days, then 4 tabs for 2 days, then 3 tabs for 2 days, 2 tabs for 2 days, then 1 tab by mouth daily for 2 days 03/15/18   Jacalyn Lefevre, MD      Allergies    Patient has no known allergies.    Review of Systems   Review of Systems Negative except as per HPI Physical Exam Updated Vital Signs BP (!) 139/90   Pulse 80   Temp (!) 101.6 F  (38.7 C) (Oral)   Resp 16   SpO2 100%  Physical Exam Vitals and nursing note reviewed.  Constitutional:      General: He is not in acute distress.    Appearance: He is well-developed. He is not diaphoretic.     Comments: Appears to feel unwell  HENT:     Head: Normocephalic and atraumatic.  Cardiovascular:     Rate and Rhythm: Normal rate and regular rhythm.     Heart sounds: Normal heart sounds.  Pulmonary:     Effort: Pulmonary effort is normal.     Breath sounds: Normal breath sounds.  Skin:    General: Skin is warm and dry.     Findings: No erythema or rash.  Neurological:     Mental Status: He is alert and oriented to person, place, and time.  Psychiatric:        Behavior: Behavior normal.     ED Results / Procedures / Treatments   Labs (all labs ordered are listed, but only abnormal results are displayed) Labs Reviewed  RESP PANEL BY RT-PCR (FLU A&B, COVID) ARPGX2 - Abnormal; Notable for the following components:      Result Value   SARS Coronavirus 2 by RT PCR POSITIVE (*)    All other components within normal limits  COMPREHENSIVE METABOLIC PANEL - Abnormal; Notable for the following components:   Glucose, Bld 122 (*)    All other components within normal limits  CBC WITH DIFFERENTIAL/PLATELET - Abnormal; Notable for the following components:   WBC 3.7 (*)    HCT 38.4 (*)    Lymphs Abs 0.5 (*)    All other components within normal limits    EKG None  Radiology DG Chest 2 View  Result Date: 01/16/2022 CLINICAL DATA:  Cough and shortness of breath. EXAM: CHEST - 2 VIEW COMPARISON:  Chest radiograph 05/22/2019 FINDINGS: Stable cardiac and mediastinal contours. Interval improvement of previously described mid and lower lung opacities. No pleural effusion or pneumothorax. Osseous structures unremarkable. IMPRESSION: No acute cardiopulmonary process. Electronically Signed   By: Annia Belt M.D.   On: 01/16/2022 18:40    Procedures Procedures    Medications  Ordered in ED Medications  ibuprofen (ADVIL) tablet 800 mg (has no administration in time range)  acetaminophen (TYLENOL) tablet 650 mg (650 mg Oral Given 01/16/22 1804)    ED Course/ Medical Decision Making/ A&P                           Medical Decision Making  51 year old male presents with fever, cough, body aches onset today.  Arrives to the ER febrile with temp of 101.6, appears to feel unwell.  Patient is COVID-positive, O2 sat 100% on room air.  No significant past medical history, occasional smoker, low risk.  Fully vaccinated.  Recommend Motrin and Tylenol, given Tessalon for cough.  Recommend recheck with PCP if symptoms persist, return to ER for worsening or concerning symptoms.        Final Clinical Impression(s) / ED Diagnoses Final diagnoses:  COVID    Rx / DC Orders ED Discharge Orders          Ordered    benzonatate (TESSALON) 200 MG capsule  Every 8 hours        01/17/22 0005              Jeannie Fend, PA-C 01/17/22 0010    Nira Conn, MD 01/17/22 [phone]

## 2022-01-17 NOTE — Discharge Instructions (Addendum)
Tessalon as needed as prescribed for cough. Motrin as needed as directed for body aches, fevers. Follow-up with your doctor as needed.  Return to ER for severe or concerning symptoms.

## 2022-03-08 ENCOUNTER — Other Ambulatory Visit: Payer: Self-pay

## 2022-03-08 DIAGNOSIS — Z125 Encounter for screening for malignant neoplasm of prostate: Secondary | ICD-10-CM

## 2022-03-13 ENCOUNTER — Other Ambulatory Visit: Payer: Self-pay | Admitting: *Deleted

## 2022-03-13 DIAGNOSIS — Z1211 Encounter for screening for malignant neoplasm of colon: Secondary | ICD-10-CM

## 2022-03-13 DIAGNOSIS — Z125 Encounter for screening for malignant neoplasm of prostate: Secondary | ICD-10-CM

## 2022-03-13 NOTE — Progress Notes (Signed)
Patient: Grant King           Date of Birth: 1970/10/08           MRN: [id number] Visit Date: 03/13/2022 PCP: Micheline Chapman, NP  Prostate Cancer Screening Date of last physical exam:  (N/A) Date of last rectal exam:  (N/A) Have you ever had any of the following?: None Have you ever had or been told you have an allergy to latex products?: No Are you currently taking any natural prostate preparations?: No Are you currently experiencing any urinary symptoms?: No  Prostate Exam Exam not completed.  PSA Only Completed. FIT Test given to patient to complete at home.  Patient's History Patient Active Problem List   Diagnosis Date Noted   Pure hypercholesterolemia 04/21/2016   No past medical history on file.  Family History  Problem Relation Age of Onset   Kidney disease Father     Social History   Occupational History   Not on file  Tobacco Use   Smoking status: Light Smoker    Packs/day: 0.25    Types: Cigarettes   Smokeless tobacco: Never  Substance and Sexual Activity   Alcohol use: No   Drug use: No   Sexual activity: Not on file

## 2022-03-14 LAB — PSA: Prostate Specific Ag, Serum: 1.4 ng/mL (ref 0.0–4.0)

## 2022-03-15 ENCOUNTER — Telehealth: Payer: Self-pay | Admitting: Hematology and Oncology

## 2022-03-15 NOTE — Telephone Encounter (Signed)
Spoke with patient regarding PSA results of 1.4 and recommend repeat next year. Patient verbalized understanding.

## 2023-03-26 ENCOUNTER — Other Ambulatory Visit: Payer: Self-pay

## 2023-03-26 DIAGNOSIS — Z125 Encounter for screening for malignant neoplasm of prostate: Secondary | ICD-10-CM

## 2023-03-30 ENCOUNTER — Other Ambulatory Visit: Payer: Self-pay | Admitting: *Deleted

## 2023-03-30 DIAGNOSIS — Z125 Encounter for screening for malignant neoplasm of prostate: Secondary | ICD-10-CM

## 2023-03-30 NOTE — Progress Notes (Signed)
Patient: Grant King           Date of Birth: 28-Jun-1970           MRN: [id number] Visit Date: 03/30/2023 PCP: Henrietta Hoover, NP  Prostate Cancer Screening Date of last physical exam:  (Over one year ago) Date of last rectal exam:  (N/A) Have you ever had any of the following?: Enlarged prostate, Family member with prostate cancer Have you ever had or been told you have an allergy to latex products?: No Are you currently taking any natural prostate preparations?: No Are you currently experiencing any urinary symptoms?: Yes If yes, please explain::  (Abdominal Pain, Feeling Full, Difficulty starting stream, History of BPH and taking Flomax states no improvement)  Prostate Exam Exam not completed. PSA blood test only completed.   Patient's History Patient Active Problem List   Diagnosis Date Noted   Pure hypercholesterolemia 04/21/2016   No past medical history on file.  Family History  Problem Relation Age of Onset   Kidney disease Father     Social History   Occupational History   Not on file  Tobacco Use   Smoking status: Light Smoker    Current packs/day: 0.25    Types: Cigarettes   Smokeless tobacco: Never  Substance and Sexual Activity   Alcohol use: No   Drug use: No   Sexual activity: Not on file

## 2023-03-31 LAB — PSA: Prostate Specific Ag, Serum: 1.3 ng/mL (ref 0.0–4.0)

## 2023-05-04 ENCOUNTER — Encounter: Payer: Self-pay | Admitting: Nurse Practitioner

## 2023-05-04 ENCOUNTER — Ambulatory Visit: Payer: BC Managed Care – PPO | Admitting: Nurse Practitioner

## 2023-05-04 VITALS — BP 129/93 | HR 87 | Temp 98.6°F | Resp 12 | Ht 69.0 in | Wt 148.0 lb

## 2023-05-04 DIAGNOSIS — E119 Type 2 diabetes mellitus without complications: Secondary | ICD-10-CM | POA: Diagnosis not present

## 2023-05-04 LAB — POCT GLYCOSYLATED HEMOGLOBIN (HGB A1C): HbA1c, POC (controlled diabetic range): 8.8 % — AB (ref 0.0–7.0)

## 2023-05-04 MED ORDER — BLOOD GLUCOSE MONITORING SUPPL DEVI: 1.0000 | Freq: Three times a day (TID) | 0 refills | Status: AC

## 2023-05-04 MED ORDER — BLOOD GLUCOSE TEST VI STRP: 1.0000 | ORAL_STRIP | Freq: Three times a day (TID) | 0 refills | Status: DC

## 2023-05-04 MED ORDER — LANCET DEVICE MISC: 1.0000 | Freq: Three times a day (TID) | 0 refills | Status: AC

## 2023-05-04 MED ORDER — BLOOD PRESSURE KIT: 1.0000 [IU] | PACK | Freq: Every day | 0 refills | Status: DC

## 2023-05-04 MED ORDER — METFORMIN HCL ER 500 MG PO TB24: 500.0000 mg | ORAL_TABLET | Freq: Every day | ORAL | 2 refills | Status: DC

## 2023-05-04 MED ORDER — LANCETS MISC. MISC: 1.0000 | Freq: Three times a day (TID) | 0 refills | Status: AC

## 2023-05-04 NOTE — Progress Notes (Signed)
Subjective   Patient ID: Grant King, male    DOB: 06/18/1970, 52 y.o.   MRN: [id number]  Chief Complaint  Patient presents with   Diabetes    Referring provider: Henrietta Hoover, NP  Grant King is a 52 y.o. male with No past medical history on file.   HPI  Patient presents today to establish care.  He states that a couple weeks ago he checked his blood sugar with his friend's glucometer because he was curious to see how much it would be.  It was elevated at 140.  Patient decided to make an appointment to be checked for diabetes.  A1c in the office today was 8.8.  We did discuss diabetic diet and will start patient on metformin. Denies f/c/s, n/v/d, hemoptysis, PND, leg swelling Denies chest pain or edema    No Known Allergies  Immunization History  Administered Date(s) Administered   Tdap 10/14/2015    Tobacco History: Social History   Tobacco Use  Smoking Status Light Smoker   Current packs/day: 0.25   Types: Cigarettes  Smokeless Tobacco Never   Ready to quit: Not Answered Counseling given: Not Answered   Outpatient Encounter Medications as of 05/04/2023  Medication Sig   Blood Pressure KIT 1 Units by Does not apply route daily.   metFORMIN (GLUCOPHAGE-XR) 500 MG 24 hr tablet Take 1 tablet (500 mg total) by mouth daily with breakfast.   ibuprofen (ADVIL) 600 MG tablet Take 1 tablet (600 mg total) by mouth every 6 (six) hours as needed. (Patient not taking: Reported on 05/04/2023)   loratadine (ALLERGY) 10 MG tablet Take 10 mg by mouth daily as needed for allergies. (Patient not taking: Reported on 05/04/2023)   methocarbamol (ROBAXIN) 500 MG tablet Take 2 tablets (1,000 mg total) by mouth every 8 (eight) hours as needed for muscle spasms. (Patient not taking: Reported on 05/04/2023)   ondansetron (ZOFRAN ODT) 4 MG disintegrating tablet Take 1 tablet (4 mg total) by mouth every 8 (eight) hours as needed for nausea or vomiting. (Patient not taking:  Reported on 05/04/2023)   predniSONE (STERAPRED UNI-PAK 21 TAB) 10 MG (21) TBPK tablet Take 6 tabs by mouth daily  for 2 days, then 5 tabs for 2 days, then 4 tabs for 2 days, then 3 tabs for 2 days, 2 tabs for 2 days, then 1 tab by mouth daily for 2 days (Patient not taking: Reported on 05/04/2023)   No facility-administered encounter medications on file as of 05/04/2023.    Review of Systems  Review of Systems  Constitutional: Negative.   HENT: Negative.    Cardiovascular: Negative.   Gastrointestinal: Negative.   Allergic/Immunologic: Negative.   Neurological: Negative.   Psychiatric/Behavioral: Negative.       Objective:   BP (!) 129/93   Pulse 87   Temp 98.6 F (37 C)   Resp 12   Ht 5\' 9"  (1.753 m)   Wt 148 lb (67.1 kg)   SpO2 100%   BMI 21.86 kg/m   Wt Readings from Last 5 Encounters:  05/04/23 148 lb (67.1 kg)  10/14/15 154 lb (69.9 kg)     Physical Exam Vitals and nursing note reviewed.  Constitutional:      General: He is not in acute distress.    Appearance: He is well-developed.  Cardiovascular:     Rate and Rhythm: Normal rate and regular rhythm.  Pulmonary:     Effort: Pulmonary effort is normal.     Breath sounds: Normal  breath sounds.  Skin:    General: Skin is warm and dry.  Neurological:     Mental Status: He is alert and oriented to person, place, and time.       Assessment & Plan:   Type 2 diabetes mellitus without complication, without long-term current use of insulin (HCC) -     POCT glycosylated hemoglobin (Hb A1C) -     metFORMIN HCl ER; Take 1 tablet (500 mg total) by mouth daily with breakfast.  Dispense: 30 tablet; Refill: 2 -     CBC -     Comprehensive metabolic panel -     Blood Pressure; 1 Units by Does not apply route daily.  Dispense: 1 kit; Refill: 0 -     Lipid panel     Return in about 3 months (around 08/04/2023).   Ivonne Andrew, NP 05/04/2023

## 2023-05-04 NOTE — Patient Instructions (Addendum)
1. Prediabetes  - POCT glycosylated hemoglobin (Hb A1C)  2. Type 2 diabetes mellitus without complication, without long-term current use of insulin (HCC)  - metFORMIN (GLUCOPHAGE-XR) 500 MG 24 hr tablet; Take 1 tablet (500 mg total) by mouth daily with breakfast.  Dispense: 30 tablet; Refill: 2 - CBC - Comprehensive metabolic panel - Blood Pressure KIT; 1 Units by Does not apply route daily.  Dispense: 1 kit; Refill: 0   Follow up:  Follow up in 3 months

## 2023-05-05 LAB — COMPREHENSIVE METABOLIC PANEL
ALT: 14 [IU]/L (ref 0–44)
AST: 14 [IU]/L (ref 0–40)
Albumin: 4.3 g/dL (ref 3.8–4.9)
Alkaline Phosphatase: 62 [IU]/L (ref 44–121)
BUN/Creatinine Ratio: 18 (ref 9–20)
BUN: 15 mg/dL (ref 6–24)
Bilirubin Total: 0.4 mg/dL (ref 0.0–1.2)
CO2: 22 mmol/L (ref 20–29)
Calcium: 9.5 mg/dL (ref 8.7–10.2)
Chloride: 99 mmol/L (ref 96–106)
Creatinine, Ser: 0.85 mg/dL (ref 0.76–1.27)
Globulin, Total: 2.7 g/dL (ref 1.5–4.5)
Glucose: 247 mg/dL — ABNORMAL HIGH (ref 70–99)
Potassium: 4.2 mmol/L (ref 3.5–5.2)
Sodium: 136 mmol/L (ref 134–144)
Total Protein: 7 g/dL (ref 6.0–8.5)
eGFR: 104 mL/min/{1.73_m2} (ref >59)

## 2023-05-05 LAB — CBC
Hematocrit: 45 % (ref 37.5–51.0)
Hemoglobin: 14.7 g/dL (ref 13.0–17.7)
MCH: 27 pg (ref 26.6–33.0)
MCHC: 32.7 g/dL (ref 31.5–35.7)
MCV: 83 fL (ref 79–97)
Platelets: 265 10*3/uL (ref 150–450)
RBC: 5.44 x10E6/uL (ref 4.14–5.80)
RDW: 12.8 % (ref 11.6–15.4)
WBC: 4.5 10*3/uL (ref 3.4–10.8)

## 2023-05-05 LAB — LIPID PANEL
Chol/HDL Ratio: 4.4 ratio (ref 0.0–5.0)
Cholesterol, Total: 213 mg/dL — ABNORMAL HIGH (ref 100–199)
HDL: 48 mg/dL (ref >39)
LDL Chol Calc (NIH): 149 mg/dL — ABNORMAL HIGH (ref 0–99)
Triglycerides: 88 mg/dL (ref 0–149)
VLDL Cholesterol Cal: 16 mg/dL (ref 5–40)

## 2023-05-07 ENCOUNTER — Other Ambulatory Visit: Payer: Self-pay | Admitting: Nurse Practitioner

## 2023-05-07 ENCOUNTER — Telehealth: Payer: Self-pay | Admitting: Nurse Practitioner

## 2023-05-07 DIAGNOSIS — E785 Hyperlipidemia, unspecified: Secondary | ICD-10-CM

## 2023-05-07 DIAGNOSIS — E119 Type 2 diabetes mellitus without complications: Secondary | ICD-10-CM

## 2023-05-07 MED ORDER — ROSUVASTATIN CALCIUM 5 MG PO TABS: 5.0000 mg | ORAL_TABLET | Freq: Every day | ORAL | 11 refills | Status: AC

## 2023-05-07 MED ORDER — METFORMIN HCL ER 500 MG PO TB24: 500.0000 mg | ORAL_TABLET | Freq: Two times a day (BID) | ORAL | 2 refills | Status: DC

## 2023-05-07 NOTE — Telephone Encounter (Signed)
Copied from CRM [id number]. Topic: Clinical - Medication Question >> May 04, 2023  4:26 PM Grant King wrote: Reason for CRM: Pt has some questions about his metFORMIN (GLUCOPHAGE-XR) 500 MG 24 hr tablet. The directions say take one a day, but the doctor told him to take two a day. Please f/u to confirm. CB# [phone]

## 2023-05-15 ENCOUNTER — Telehealth: Payer: Self-pay | Admitting: Nurse Practitioner

## 2023-05-15 NOTE — Telephone Encounter (Signed)
Copied from CRM [id number]. Topic: Clinical - Prescription Issue >> May 15, 2023 11:55 AM Lorin Glass B wrote: Reason for CRM: Patient states that his pharmacy stated the Blood Glucose Monitoring Suppl DEVI rx is not covered by his insurance. Patient would like to know if another version can be sent to his pharmacy at [facility] [address]. Callback [phone]

## 2023-05-17 MED ORDER — BLOOD GLUCOSE TEST VI STRP: 1.0000 | ORAL_STRIP | Freq: Three times a day (TID) | 3 refills | Status: AC

## 2023-05-17 NOTE — Progress Notes (Signed)
Contacted [facility]. The meter and lancets did go through his insurance for ~ $7. They needed a script for test strips which I sent. Contacted patient via interpreter, left voicemail.

## 2023-05-17 NOTE — Addendum Note (Signed)
Addended by: Nilda Simmer T on: 05/17/2023 09:14 AM   Modules accepted: Orders

## 2023-05-18 ENCOUNTER — Ambulatory Visit: Payer: Self-pay | Admitting: Nurse Practitioner

## 2023-06-22 ENCOUNTER — Telehealth: Payer: Self-pay | Admitting: Nurse Practitioner

## 2023-06-22 ENCOUNTER — Other Ambulatory Visit: Payer: Self-pay | Admitting: Nurse Practitioner

## 2023-06-22 ENCOUNTER — Other Ambulatory Visit: Payer: Self-pay

## 2023-06-22 DIAGNOSIS — E119 Type 2 diabetes mellitus without complications: Secondary | ICD-10-CM

## 2023-06-22 MED ORDER — METFORMIN HCL ER 500 MG PO TB24: 500.0000 mg | ORAL_TABLET | Freq: Two times a day (BID) | ORAL | 2 refills | Status: DC

## 2023-06-22 NOTE — Telephone Encounter (Signed)
 Caller & Relationship to patient:  MRN #  [id number]   Call Back Number:   Date of Last Office Visit: 05/15/2023     Date of Next Office Visit: 08/10/2023    Medication(s) to be Refilled: Diabetics med's  Preferred Pharmacy:   ** Please notify patient to allow 48-72 hours to process** **Let patient know to contact pharmacy at the end of the day to make sure medication is ready. ** **If patient has not been seen in a year or longer, book an appointment **Advise to use MyChart for refill requests OR to contact their pharmacy

## 2023-06-22 NOTE — Telephone Encounter (Signed)
 Done [facility]

## 2023-07-29 ENCOUNTER — Other Ambulatory Visit: Payer: Self-pay | Admitting: Nurse Practitioner

## 2023-07-29 DIAGNOSIS — E119 Type 2 diabetes mellitus without complications: Secondary | ICD-10-CM

## 2023-08-03 ENCOUNTER — Ambulatory Visit: Payer: Self-pay | Admitting: Nurse Practitioner

## 2023-08-10 ENCOUNTER — Ambulatory Visit: Payer: Self-pay | Admitting: Nurse Practitioner

## 2024-02-25 ENCOUNTER — Other Ambulatory Visit: Payer: Self-pay | Admitting: Nurse Practitioner

## 2024-02-25 DIAGNOSIS — E119 Type 2 diabetes mellitus without complications: Secondary | ICD-10-CM

## 2024-02-25 NOTE — Telephone Encounter (Signed)
 Please advise [facility]

## 2024-04-23 ENCOUNTER — Other Ambulatory Visit: Payer: Self-pay

## 2024-04-23 DIAGNOSIS — Z125 Encounter for screening for malignant neoplasm of prostate: Secondary | ICD-10-CM

## 2024-04-25 ENCOUNTER — Other Ambulatory Visit

## 2024-04-26 LAB — SPECIMEN STATUS REPORT

## 2024-04-26 LAB — PSA: Prostate Specific Ag, Serum: 1.3 ng/mL (ref 0.0–4.0)

## 2024-04-28 ENCOUNTER — Ambulatory Visit: Payer: Self-pay

## 2024-05-02 NOTE — Patient Instructions (Signed)
 [facility]RA

## 2024-07-14 ENCOUNTER — Other Ambulatory Visit: Payer: Self-pay | Admitting: Nurse Practitioner

## 2024-07-14 DIAGNOSIS — E119 Type 2 diabetes mellitus without complications: Secondary | ICD-10-CM

## 2024-07-14 NOTE — Telephone Encounter (Signed)
 metFORMIN  (GLUCOPHAGE -XR) 500 MG 24 hr tablet [Pharmacy Med Name: METFORMIN  HCL ER 500 MG TABLET]
